# Patient Record
Sex: Male | Born: 1970 | Race: White | Hispanic: No | Smoking: Former smoker
Health system: Southern US, Community
[De-identification: ages and names within clinical notes are randomized; demographics above are authoritative.]

## PROBLEM LIST (undated history)

## (undated) DIAGNOSIS — I721 Aneurysm of artery of upper extremity: Secondary | ICD-10-CM

## (undated) DIAGNOSIS — I10 Essential (primary) hypertension: Secondary | ICD-10-CM

## (undated) DIAGNOSIS — M5412 Radiculopathy, cervical region: Secondary | ICD-10-CM

## (undated) DIAGNOSIS — J189 Pneumonia, unspecified organism: Secondary | ICD-10-CM

## (undated) DIAGNOSIS — K219 Gastro-esophageal reflux disease without esophagitis: Secondary | ICD-10-CM

## (undated) DIAGNOSIS — M25511 Pain in right shoulder: Secondary | ICD-10-CM

## (undated) DIAGNOSIS — J302 Other seasonal allergic rhinitis: Secondary | ICD-10-CM

## (undated) DIAGNOSIS — M5020 Other cervical disc displacement, unspecified cervical region: Secondary | ICD-10-CM

## (undated) HISTORY — PX: OTHER SURGICAL HISTORY: SHX169

## (undated) HISTORY — PX: HERNIA REPAIR: SHX51

## (undated) HISTORY — PX: FOOT SURGERY: SHX648

---

## 2005-12-01 ENCOUNTER — Ambulatory Visit: Payer: Self-pay | Admitting: Unknown Physician Specialty

## 2006-01-27 ENCOUNTER — Ambulatory Visit: Payer: Self-pay | Admitting: Unknown Physician Specialty

## 2006-09-14 ENCOUNTER — Ambulatory Visit: Payer: Self-pay | Admitting: Unknown Physician Specialty

## 2007-04-18 ENCOUNTER — Other Ambulatory Visit: Payer: Self-pay

## 2007-04-18 ENCOUNTER — Inpatient Hospital Stay: Payer: Self-pay | Admitting: Internal Medicine

## 2009-02-14 ENCOUNTER — Emergency Department: Payer: Self-pay | Admitting: Emergency Medicine

## 2009-06-01 ENCOUNTER — Ambulatory Visit: Payer: Self-pay | Admitting: Family Medicine

## 2010-12-29 ENCOUNTER — Emergency Department: Payer: Self-pay | Admitting: Emergency Medicine

## 2011-03-29 ENCOUNTER — Ambulatory Visit: Payer: Self-pay | Admitting: Internal Medicine

## 2011-04-27 ENCOUNTER — Ambulatory Visit: Payer: Self-pay | Admitting: Neurology

## 2011-10-21 ENCOUNTER — Emergency Department: Payer: Self-pay | Admitting: Emergency Medicine

## 2013-03-05 ENCOUNTER — Ambulatory Visit: Payer: Self-pay

## 2013-04-09 ENCOUNTER — Ambulatory Visit: Payer: Self-pay

## 2013-10-13 ENCOUNTER — Emergency Department: Payer: Self-pay | Admitting: Internal Medicine

## 2015-07-11 ENCOUNTER — Emergency Department
Admission: EM | Admit: 2015-07-11 | Discharge: 2015-07-11 | Disposition: A | Discharge status: Home / Self Care | Payer: Self-pay | Attending: Emergency Medicine | Admitting: Emergency Medicine

## 2015-07-11 ENCOUNTER — Emergency Department: Payer: Self-pay

## 2015-07-11 DIAGNOSIS — E860 Dehydration: Secondary | ICD-10-CM | POA: Insufficient documentation

## 2015-07-11 DIAGNOSIS — R55 Syncope and collapse: Secondary | ICD-10-CM | POA: Insufficient documentation

## 2015-07-11 HISTORY — DX: Gastro-esophageal reflux disease without esophagitis: K21.9

## 2015-07-11 HISTORY — DX: Other seasonal allergic rhinitis: J30.2

## 2015-07-11 LAB — CBC WITH DIFFERENTIAL/PLATELET
BASOS ABS: 0 10*3/uL (ref 0–0.1)
BASOS PCT: 0 %
EOS ABS: 0.2 10*3/uL (ref 0–0.7)
EOS PCT: 2 %
HCT: 47.5 % (ref 40.0–52.0)
Hemoglobin: 16.7 g/dL (ref 13.0–18.0)
Lymphocytes Relative: 24 %
Lymphs Abs: 1.9 10*3/uL (ref 1.0–3.6)
MCH: 30 pg (ref 26.0–34.0)
MCHC: 35.1 g/dL (ref 32.0–36.0)
MCV: 85.4 fL (ref 80.0–100.0)
MONO ABS: 0.7 10*3/uL (ref 0.2–1.0)
Monocytes Relative: 9 %
Neutro Abs: 5.2 10*3/uL (ref 1.4–6.5)
Neutrophils Relative %: 65 %
PLATELETS: 281 10*3/uL (ref 150–440)
RBC: 5.56 MIL/uL (ref 4.40–5.90)
RDW: 13.4 % (ref 11.5–14.5)
WBC: 8 10*3/uL (ref 3.8–10.6)

## 2015-07-11 LAB — COMPREHENSIVE METABOLIC PANEL
ALT: 42 U/L (ref 17–63)
AST: 27 U/L (ref 15–41)
Albumin: 4.2 g/dL (ref 3.5–5.0)
Alkaline Phosphatase: 46 U/L (ref 38–126)
Anion gap: 7 (ref 5–15)
BILIRUBIN TOTAL: 0.6 mg/dL (ref 0.3–1.2)
BUN: 11 mg/dL (ref 6–20)
CO2: 25 mmol/L (ref 22–32)
CREATININE: 0.83 mg/dL (ref 0.61–1.24)
Calcium: 9.5 mg/dL (ref 8.9–10.3)
Chloride: 107 mmol/L (ref 101–111)
GFR calc Af Amer: >60 mL/min (ref >60)
Glucose, Bld: 139 mg/dL — ABNORMAL HIGH (ref 65–99)
POTASSIUM: 3.7 mmol/L (ref 3.5–5.1)
Sodium: 139 mmol/L (ref 135–145)
TOTAL PROTEIN: 6.9 g/dL (ref 6.5–8.1)

## 2015-07-11 LAB — LIPASE, BLOOD: LIPASE: 37 U/L (ref 11–51)

## 2015-07-11 MED ORDER — SODIUM CHLORIDE 0.9 % IV BOLUS (SEPSIS)
1000.0000 mL | Freq: Once | INTRAVENOUS | Status: AC
Start: 2015-07-11 — End: 2015-07-11
  Administered 2015-07-11: 1000 mL via INTRAVENOUS

## 2015-07-11 NOTE — ED Notes (Signed)
Patient passed out in kitchen this evening.

## 2015-07-11 NOTE — ED Provider Notes (Signed)
[facility] Emergency Department Provider Note  ____________________________________________  Time seen: 9:40 PM  I have reviewed the triage vital signs and the nursing notes.   HISTORY  Chief Complaint Loss of Consciousness    HPI George Padilla is a 45 y.o. male brought to the ED after syncope at about 8:30 PM tonight. He is in his usual state of health, had discussion up from the couch and walked to the kitchen was looking as phone, when he turned around he suddenly passed out. Had no preceding symptoms. No chest pain shortness of breath back pain abdominal pain headache numbness tingling weakness or vision changes. When he woke up he did report some nausea but no focal symptoms. This is never happened before. He does work outside during labor and land grating, and does not drink much by way of fluids. He also tends to skip meals. Currently he is asymptomatic.     Past Medical History  Diagnosis Date  . GERD (gastroesophageal reflux disease)   . Seasonal allergies      There are no active problems to display for this patient.    Past Surgical History  Procedure Laterality Date  . Hernia repair       No current outpatient prescriptions on file. None  Allergies Bee venom   No family history on file.  Social History Social History  Substance Use Topics  . Smoking status: Never Smoker   . Smokeless tobacco: Never Used  . Alcohol Use: Yes    Review of Systems  Constitutional:   No fever or chills.  Eyes:   No vision changes.  ENT:   No sore throat. No rhinorrhea. Cardiovascular:   No chest pain. Respiratory:   No dyspnea or cough. Gastrointestinal:   Negative for abdominal pain, vomiting and diarrhea.  No bloody stool. Genitourinary:   Negative for dysuria or difficulty urinating. Musculoskeletal:   Negative for focal pain or swelling Neurological:   Negative for headaches 10-point ROS otherwise  negative.  ____________________________________________   PHYSICAL EXAM:  VITAL SIGNS: ED Triage Vitals  Enc Vitals Group     BP 07/11/15 2055 151/95 mmHg     Pulse Rate 07/11/15 2055 87     Resp 07/11/15 2055 19     Temp 07/11/15 2055 98 F (36.7 C)     Temp Source 07/11/15 2055 Oral     SpO2 07/11/15 2055 98 %     Weight 07/11/15 2055 180 lb (81.647 kg)     Height 07/11/15 2055  (1.727 m)     Head Cir --      Peak Flow --      Pain Score 07/11/15 2058 2     Pain Loc --      Pain Edu? --      Excl. in GC? --     Vital signs reviewed, nursing assessments reviewed.   Constitutional:   Alert and oriented. Well appearing and in no distress. Eyes:   No scleral icterus. No conjunctival pallor. PERRL. EOMI ENT   Head:   Normocephalic and atraumatic.   Nose:   No congestion/rhinnorhea. No septal hematoma   Mouth/Throat:   Dry mucous members, no pharyngeal erythema. No peritonsillar mass.    Neck:   No stridor. No SubQ emphysema. No meningismus. Hematological/Lymphatic/Immunilogical:   No cervical lymphadenopathy. Cardiovascular:   RRR. Symmetric bilateral radial and DP pulses.  No murmurs.  Respiratory:   Normal respiratory effort without tachypnea nor retractions. Breath sounds are  clear and equal bilaterally. No wheezes/rales/rhonchi. Gastrointestinal:   Soft with epigastric tenderness. Non distended. There is no CVA tenderness.  No rebound, rigidity, or guarding. Genitourinary:   deferred Musculoskeletal:   Nontender with normal range of motion in all extremities. No joint effusions.  No lower extremity tenderness.  No edema. Neurologic:   Normal speech and language.  CN 2-10 normal. Motor grossly intact. Normal gait No pronator drift No gross focal neurologic deficits are appreciated.  Skin:    Skin is warm, dry and intact. No rash noted.  No petechiae, purpura, or bullae.  ____________________________________________    LABS (pertinent  positives/negatives) (all labs ordered are listed, but only abnormal results are displayed) Labs Reviewed  COMPREHENSIVE METABOLIC PANEL - Abnormal; Notable for the following:    Glucose, Bld 139 (*)    All other components within normal limits  LIPASE, BLOOD  CBC WITH DIFFERENTIAL/PLATELET   ____________________________________________   EKG  Interpreted by me Sinus rhythm rate of 87, normal axis and intervals. Normal QRS ST segments and T waves  ____________________________________________    RADIOLOGY  X-ray abdomen and chest unremarkable  ____________________________________________   PROCEDURES   ____________________________________________   INITIAL IMPRESSION / ASSESSMENT AND PLAN / ED COURSE  Pertinent labs & imaging results that were available during my care of the patient were reviewed by me and considered in my medical decision making (see chart for details).  Patient well appearing no acute distress. Head syncope today, never had this before. Most likely dehydration due to his extensive work outside in hot weather which is been consistently in the 80s recently during the day. This is compounded by the report that he does not drink much fluids. No alarming symptoms before or after the syncope episode, total downtime of just a few minutes. No apparent seizure. Vital signs stable and unremarkable here. Has epigastric tenderness, he states this is normal for him due to his GERD. Low suspicion for AAA dissection perforation obstruction cholecystitis pancreatitis. Labs unremarkable, after IV fluids patient is ambulatory and remains a symptomatic. We'll discharge home. I informed him that his EKG does look like it possibly has a delta-wave in V2, but he had no chest pain shortness of breath dizziness or palpitations prior to the event. It would be highly unusual for a 45 year old male to have a first present presentation of WPW. I strongly encouraged him to follow up with  primary care who can reassess his symptoms and repeat EKG and plan for further workup if necessary at that time.     ____________________________________________   FINAL CLINICAL IMPRESSION(S) / ED DIAGNOSES  Final diagnoses:  Syncope, unspecified syncope type  Dehydration       Portions of this note were generated with dragon dictation software. Dictation errors may occur despite best attempts at proofreading.   Sharman CheekPhillip Naturi Alarid, MD 07/11/15 [phone]

## 2015-07-11 NOTE — Discharge Instructions (Signed)
Dehydration, Adult °Dehydration is a condition in which you do not have enough fluid or water in your body. It happens when you take in less fluid than you lose. Vital organs such as the kidneys, brain, and heart cannot function without a proper amount of fluids. Any loss of fluids from the body can cause dehydration.  °Dehydration can range from mild to severe. This condition should be treated right away to help prevent it from becoming severe. °CAUSES  °This condition may be caused by: °· Vomiting. °· Diarrhea. °· Excessive sweating, such as when exercising in hot or humid weather. °· Not drinking enough fluid during strenuous exercise or during an illness. °· Excessive urine output. °· Fever. °· Certain medicines. °RISK FACTORS °This condition is more likely to develop in: °· People who are taking certain medicines that cause the body to lose excess fluid (diuretics).   °· People who have a chronic illness, such as diabetes, that may increase urination. °· Older adults.   °· People who live at high altitudes.   °· People who participate in endurance sports.   °SYMPTOMS  °Mild Dehydration °· Thirst. °· Dry lips. °· Slightly dry mouth. °· Dry, warm skin. °Moderate Dehydration °· Very dry mouth.   °· Muscle cramps.   °· Dark urine and decreased urine production.   °· Decreased tear production.   °· Headache.   °· Light-headedness, especially when you stand up from a sitting position.   °Severe Dehydration °· Changes in skin.   °· Cold and clammy skin.   °· Skin does not spring back quickly when lightly pinched and released.   °· Changes in body fluids.   °· Extreme thirst.   °· No tears.   °· Not able to sweat when body temperature is high, such as in hot weather.   °· Minimal urine production.   °· Changes in vital signs.   °· Rapid, weak pulse (more than 100 beats per minute when you are sitting still).   °· Rapid breathing.   °· Low blood pressure.   °· Other changes.   °· Sunken eyes.   °· Cold hands and feet.    °· Confusion. °· Lethargy and difficulty being awakened. °· Fainting (syncope).   °· Short-term weight loss.   °· Unconsciousness. °DIAGNOSIS  °This condition may be diagnosed based on your symptoms. You may also have tests to determine how severe your dehydration is. These tests may include:  °· Urine tests.   °· Blood tests.   °TREATMENT  °Treatment for this condition depends on the severity. Mild or moderate dehydration can often be treated at home. Treatment should be started right away. Do not wait until dehydration becomes severe. Severe dehydration needs to be treated at the hospital. °Treatment for Mild Dehydration °· Drinking plenty of water to replace the fluid you have lost.   °· Replacing minerals in your blood (electrolytes) that you may have lost.   °Treatment for Moderate Dehydration  °· Consuming oral rehydration solution (ORS). °Treatment for Severe Dehydration °· Receiving fluid through an IV tube.   °· Receiving electrolyte solution through a feeding tube that is passed through your nose and into your stomach (nasogastric tube or NG tube). °· Correcting any abnormalities in electrolytes. °HOME CARE INSTRUCTIONS  °· Drink enough fluid to keep your urine clear or pale yellow.   °· Drink water or fluid slowly by taking small sips. You can also try sucking on ice cubes.  °· Have food or beverages that contain electrolytes. Examples include bananas and sports drinks. °· Take over-the-counter and prescription medicines only as told by your health care provider.   °· Prepare ORS according to the manufacturer's instructions. Take sips   of ORS every 5 minutes until your urine returns to normal. °· If you have vomiting or diarrhea, continue to try to drink water, ORS, or both.   °· If you have diarrhea, avoid:   °¨ Beverages that contain caffeine.   °¨ Fruit juice.   °¨ Milk.    °¨ Carbonated soft drinks. °· Do not take salt tablets. This can lead to the condition of having too much sodium in your body  (hypernatremia).   °SEEK MEDICAL CARE IF: °· You cannot eat or drink without vomiting. °· You have had moderate diarrhea during a period of more than 24 hours. °· You have a fever. °SEEK IMMEDIATE MEDICAL CARE IF:  °· You have extreme thirst. °· You have severe diarrhea. °· You have not urinated in 6-8 hours, or you have urinated only a small amount of very dark urine. °· You have shriveled skin. °· You are dizzy, confused, or both. °  °This information is not intended to replace advice given to you by your health care provider. Make sure you discuss any questions you have with your health care provider. °  °Document Released: 03/07/2005 Document Revised: 11/26/2014 Document Reviewed: 07/23/2014 °Elsevier Interactive Patient Education ©2016 [employer] ° °Syncope °Syncope is a medical term for fainting or passing out. This means you lose consciousness and drop to the ground. People are generally unconscious for less than 5 minutes. You may have some muscle twitches for up to 15 seconds before waking up and returning to normal. Syncope occurs more often in older adults, but it can happen to anyone. While most causes of syncope are not dangerous, syncope can be a sign of a serious medical problem. It is important to seek medical care.  °CAUSES  °Syncope is caused by a sudden drop in blood flow to the brain. The specific cause is often not determined. Factors that can bring on syncope include: °· Taking medicines that lower blood pressure. °· Sudden changes in posture, such as standing up quickly. °· Taking more medicine than prescribed. °· Standing in one place for too long. °· Seizure disorders. °· Dehydration and excessive exposure to heat. °· Low blood sugar (hypoglycemia). °· Straining to have a bowel movement. °· Heart disease, irregular heartbeat, or other circulatory problems. °· Fear, emotional distress, seeing blood, or severe pain. °SYMPTOMS  °Right before fainting, you may: °· Feel dizzy or  light-headed. °· Feel nauseous. °· See all white or all black in your field of vision. °· Have cold, clammy skin. °DIAGNOSIS  °Your health care provider will ask about your symptoms, perform a physical exam, and perform an electrocardiogram (ECG) to record the electrical activity of your heart. Your health care provider may also perform other heart or blood tests to determine the cause of your syncope which may include: °· Transthoracic echocardiogram (TTE). During echocardiography, sound waves are used to evaluate how blood flows through your heart. °· Transesophageal echocardiogram (TEE). °· Cardiac monitoring. This allows your health care provider to monitor your heart rate and rhythm in real time. °· Holter monitor. This is a portable device that records your heartbeat and can help diagnose heart arrhythmias. It allows your health care provider to track your heart activity for several days, if needed. °· Stress tests by exercise or by giving medicine that makes the heart beat faster. °TREATMENT  °In most cases, no treatment is needed. Depending on the cause of your syncope, your health care provider may recommend changing or stopping some of your medicines. °HOME CARE INSTRUCTIONS °· Have someone stay   with you until you feel stable. °· Do not drive, use machinery, or play sports until your health care provider says it is okay. °· Keep all follow-up appointments as directed by your health care provider. °· Lie down right away if you start feeling like you might faint. Breathe deeply and steadily. Wait until all the symptoms have passed. °· Drink enough fluids to keep your urine clear or pale yellow. °· If you are taking blood pressure or heart medicine, get up slowly and take several minutes to sit and then stand. This can reduce dizziness. °SEEK IMMEDIATE MEDICAL CARE IF:  °· You have a severe headache. °· You have unusual pain in the chest, abdomen, or back. °· You are bleeding from your mouth or rectum, or you  have black or tarry stool. °· You have an irregular or very fast heartbeat. °· You have pain with breathing. °· You have repeated fainting or seizure-like jerking during an episode. °· You faint when sitting or lying down. °· You have confusion. °· You have trouble walking. °· You have severe weakness. °· You have vision problems. °If you fainted, call your local emergency services (911 in [place] Do not drive yourself to the hospital.  °  °This information is not intended to replace advice given to you by your health care provider. Make sure you discuss any questions you have with your health care provider. °  °Document Released: 03/07/2005 Document Revised: 07/22/2014 Document Reviewed: 05/06/2011 °Elsevier Interactive Patient Education ©2016 Elsevier Inc. ° °

## 2017-08-21 ENCOUNTER — Ambulatory Visit: Payer: Self-pay | Admitting: Family Medicine

## 2017-08-21 ENCOUNTER — Encounter

## 2017-09-11 ENCOUNTER — Other Ambulatory Visit: Payer: Self-pay | Admitting: Family Medicine

## 2017-09-11 DIAGNOSIS — N5089 Other specified disorders of the male genital organs: Secondary | ICD-10-CM

## 2017-09-18 ENCOUNTER — Ambulatory Visit
Admission: RE | Admit: 2017-09-18 | Discharge: 2017-09-18 | Disposition: A | Discharge status: Home / Self Care | Payer: 59 | Source: Ambulatory Visit | Attending: Family Medicine | Admitting: Family Medicine

## 2017-09-18 DIAGNOSIS — N509 Disorder of male genital organs, unspecified: Secondary | ICD-10-CM | POA: Insufficient documentation

## 2017-09-18 DIAGNOSIS — N503 Cyst of epididymis: Secondary | ICD-10-CM | POA: Insufficient documentation

## 2017-09-18 DIAGNOSIS — I861 Scrotal varices: Secondary | ICD-10-CM | POA: Diagnosis not present

## 2017-09-18 DIAGNOSIS — N5089 Other specified disorders of the male genital organs: Secondary | ICD-10-CM

## 2017-09-29 ENCOUNTER — Encounter: Payer: Self-pay | Admitting: Podiatry

## 2017-09-29 ENCOUNTER — Ambulatory Visit (INDEPENDENT_AMBULATORY_CARE_PROVIDER_SITE_OTHER): Payer: 59 | Admitting: Podiatry

## 2017-09-29 VITALS — BP 138/82 | HR 75

## 2017-09-29 DIAGNOSIS — B070 Plantar wart: Secondary | ICD-10-CM

## 2017-10-02 NOTE — Progress Notes (Signed)
   Subjective: 47 year old male presenting today as a new patient with a chief complaint of a painful lesion noted to the plantar aspect of the right foot that has been there for several months. He believes another lesion is forming close to the one that is already present. Walking increases the pain. He has not done anything for treatment and was referred here by his PCP. Patient is here for further evaluation and treatment.   Past Medical History:  Diagnosis Date  . GERD (gastroesophageal reflux disease)   . Seasonal allergies     Objective: Physical Exam General: The patient is alert and oriented x3 in no acute distress.  Dermatology: Hyperkeratotic skin lesion noted to the plantar aspect of the right foot approximately 1 cm in diameter. Pinpoint bleeding noted upon debridement. Skin is warm, dry and supple bilateral lower extremities. Negative for open lesions or macerations.  Vascular: Palpable pedal pulses bilaterally. No edema or erythema noted. Capillary refill within normal limits.  Neurological: Epicritic and protective threshold grossly intact bilaterally.   Musculoskeletal Exam: Pain on palpation to the note skin lesion.  Range of motion within normal limits to all pedal and ankle joints bilateral. Muscle strength 5/5 in all groups bilateral.   Assessment: #1 plantar wart right forefoot x 2 #2 pain in right foot   Plan of Care:  #1 Patient was evaluated. #2 Excisional debridement of the plantar wart lesion was performed using a chisel blade. Cantharone was applied and the lesion was dressed with a dry sterile dressing. #3 patient is to return to clinic in 2 weeks.  Goes by George Padilla.    Felecia ShellingBrent M. Padilla, DPM [facility]  Dr. Felecia ShellingBrent M. Padilla, DPM    [address]                                        [place], [place] [address]                Office [phone]  Fax [phone](336) [phone]

## 2017-10-09 ENCOUNTER — Ambulatory Visit: Payer: Self-pay | Admitting: Urology

## 2017-10-17 ENCOUNTER — Ambulatory Visit (INDEPENDENT_AMBULATORY_CARE_PROVIDER_SITE_OTHER): Payer: 59 | Admitting: Podiatry

## 2017-10-17 ENCOUNTER — Ambulatory Visit: Payer: 59 | Admitting: Podiatry

## 2017-10-17 ENCOUNTER — Encounter: Payer: Self-pay | Admitting: Podiatry

## 2017-10-17 DIAGNOSIS — B070 Plantar wart: Secondary | ICD-10-CM | POA: Diagnosis not present

## 2017-10-22 NOTE — Progress Notes (Signed)
   Subjective: 47 year old male presenting today for follow up evaluation of two plantar warts on the right forefoot. He states he is doing well and the areas have healed appropriately. He denies any pain or complaints at this time. Patient is here for further evaluation and treatment.   Past Medical History:  Diagnosis Date  . GERD (gastroesophageal reflux disease)   . Seasonal allergies     Objective: Physical Exam General: The patient is alert and oriented x3 in no acute distress.  Dermatology: Hyperkeratotic skin lesions x 2 noted to the plantar aspect of the right foot approximately 1 cm in diameter. Pinpoint bleeding noted upon debridement. Skin is warm, dry and supple bilateral lower extremities. Negative for open lesions or macerations.  Vascular: Palpable pedal pulses bilaterally. No edema or erythema noted. Capillary refill within normal limits.  Neurological: Epicritic and protective threshold grossly intact bilaterally.   Musculoskeletal Exam: Pain on palpation to the note skin lesion.  Range of motion within normal limits to all pedal and ankle joints bilateral. Muscle strength 5/5 in all groups bilateral.   Assessment: #1 plantar wart right forefoot x 2 - resolved    Plan of Care:  #1 Patient was evaluated. #2 Light debridement of the plantar wart lesions was performed using a chisel blade. Salinocaine was applied and the lesion was dressed with a dry sterile dressing. #3 patient is to return to clinic as needed.  Goes by [name].    Felecia ShellingBrent M. Evans, DPM [facility]  Dr. Felecia ShellingBrent M. Evans, DPM    [address]                                        [place], [place] [address]                Office [phone]  Fax [phone]) [phone]

## 2017-10-31 DIAGNOSIS — K219 Gastro-esophageal reflux disease without esophagitis: Secondary | ICD-10-CM | POA: Insufficient documentation

## 2017-10-31 DIAGNOSIS — J302 Other seasonal allergic rhinitis: Secondary | ICD-10-CM | POA: Insufficient documentation

## 2017-10-31 NOTE — Progress Notes (Signed)
11/01/2017 3:09 PM   Marcene Brawnobert Jason Phillips June 20, 1970 [id number]  Referring provider: Jerl MinaHedrick, James, MD [address] [facility] [place], [place] [id number]  Chief Complaint  Patient presents with  . Testicle Pain    HPI: Patient is a 47 year old Caucasian male who was referred by Dr. Burnett ShengHedrick for testicular pain.  He was seen in June 2019 with a complaint of left testicular soreness and swelling.  Scrotal ultrasound on 09/18/2017 noted a small epididymal head cyst on each side. No other extratesticular masses. No inflammatory foci noted in either epididymis.  Testes appear normal bilaterally. No intratesticular mass on either side. No orchitis or testicular torsion on either side.  Small varicocele on the left.  He states the area of concern will enlarge and cause pain when he gets hot.  It is otherwise, not bothersome.    He is having nocturia and ED.  Patient denies any gross hematuria, dysuria or suprapubic/flank pain.  Patient denies any fevers, chills, nausea or vomiting.      PMH: Past Medical History:  Diagnosis Date  . GERD (gastroesophageal reflux disease)   . Seasonal allergies     Surgical History: Past Surgical History:  Procedure Laterality Date  . FOOT SURGERY    . HERNIA REPAIR      Home Medications:  Allergies as of 11/01/2017      Reactions   Amoxicillin Itching   Bee Venom       Medication List        Accurate as of 11/01/17  3:09 PM. Always use your most recent med list.          esomeprazole 40 MG capsule Commonly known as:  NEXIUM Take by mouth.       Allergies:  Allergies  Allergen Reactions  . Amoxicillin Itching  . Bee Venom     Family History: Family History  Problem Relation Age of Onset  . Bladder Cancer Neg Hx   . Kidney cancer Neg Hx   . Prostate cancer Neg Hx     Social History:  reports that he has quit smoking. He has quit using smokeless tobacco. He reports that he drinks alcohol. He reports that he  does not use drugs.  ROS: UROLOGY Frequent Urination?: No Hard to postpone urination?: No Burning/pain with urination?: No Get up at night to urinate?: Yes Leakage of urine?: No Urine stream starts and stops?: No Trouble starting stream?: No Do you have to strain to urinate?: No Blood in urine?: No Urinary tract infection?: No Sexually transmitted disease?: No Injury to kidneys or bladder?: No Painful intercourse?: No Weak stream?: No Erection problems?: Yes Penile pain?: No  Gastrointestinal Nausea?: No Vomiting?: No Indigestion/heartburn?: No Diarrhea?: No Constipation?: No  Constitutional Fever: No Night sweats?: No Weight loss?: No Fatigue?: No  Skin Skin rash/lesions?: No Itching?: No  Eyes Blurred vision?: No Double vision?: No  Ears/Nose/Throat Sore throat?: No Sinus problems?: No  Hematologic/Lymphatic Swollen glands?: No Easy bruising?: Yes  Cardiovascular Leg swelling?: No Chest pain?: No  Respiratory Cough?: Yes Shortness of breath?: No  Endocrine Excessive thirst?: No  Musculoskeletal Back pain?: Yes Joint pain?: Yes  Neurological Headaches?: No Dizziness?: No  Psychologic Depression?: No Anxiety?: No  Physical Exam: BP (!) 148/117 (BP Location: Left Arm, Patient Position: Sitting, Cuff Size: Normal)   Pulse 97   Ht 5\' 8"  (1.727 m)   Wt 192 lb 8 oz (87.3 kg)   BMI 29.27 kg/m   Constitutional:  Well  nourished. Alert and oriented, No acute distress. HEENT: [place] AT, moist mucus membranes.  Trachea midline, no masses. Cardiovascular: No clubbing, cyanosis, or edema. Respiratory: Normal respiratory effort, no increased work of breathing. GI: Abdomen is soft, non tender, non distended, no abdominal masses. Liver and spleen not palpable.  No hernias appreciated.  Stool sample for occult testing is not indicated.   GU: No CVA tenderness.  No bladder fullness or masses.  Patient with circumcised phallus.   Urethral meatus is  patent.  No penile discharge. No penile lesions or rashes. Scrotum without lesions, cysts, rashes and/or edema.  Testicles are located scrotally bilaterally. No masses are appreciated in the testicles. Left and right epididymis are normal.  Small left varicocele is normal.   Rectal: Patient with  normal sphincter tone. Anus and perineum without scarring or rashes. No rectal masses are appreciated. Prostate is approximately 45 grams, no nodules are appreciated. Seminal vesicles are normal. Skin: No rashes, bruises or suspicious lesions. Lymph: No cervical or inguinal adenopathy. Neurologic: Grossly intact, no focal deficits, moving all 4 extremities. Psychiatric: Normal mood and affect.  Laboratory Data: PSA   0.64 in 08/2017  Lab Results  Component Value Date   WBC 8.0 07/11/2015   HGB 16.7 07/11/2015   HCT 47.5 07/11/2015   MCV 85.4 07/11/2015   PLT 281 07/11/2015    Lab Results  Component Value Date   CREATININE 0.83 07/11/2015    No results found for: PSA  No results found for: TESTOSTERONE  No results found for: HGBA1C  No results found for: TSH  No results found for: CHOL, HDL, CHOLHDL, VLDL, LDLCALC  Lab Results  Component Value Date   AST 27 07/11/2015   Lab Results  Component Value Date   ALT 42 07/11/2015   No components found for: ALKALINEPHOPHATASE No components found for: BILIRUBINTOTAL  No results found for: ESTRADIOL  Urinalysis No results found for: COLORURINE, APPEARANCEUR, LABSPEC, PHURINE, GLUCOSEU, HGBUR, BILIRUBINUR, KETONESUR, PROTEINUR, UROBILINOGEN, NITRITE, LEUKOCYTESUR  I have reviewed the labs.   Pertinent Imaging: CLINICAL DATA:  Palpable fullness in scrotal sac  EXAM: SCROTAL ULTRASOUND  DOPPLER ULTRASOUND OF THE TESTICLES  TECHNIQUE: Complete ultrasound examination of the testicles, epididymis, and other scrotal structures was performed. Color and spectral Doppler ultrasound were also utilized to evaluate blood flow to  the testicles.  COMPARISON:  None.  FINDINGS: Right testicle  Measurements: 4.9 x 2.2 x 3.5 cm. No mass or microlithiasis visualized.  Left testicle  Measurements: 4.4 x 1.9 x 2.7 cm. No mass or microlithiasis visualized.  Right epididymis: There is a cyst in the epididymal head region on the right measuring 0.5 x 0.4 x 0.5 cm. No other right-sided extratesticular mass evident. No inflammatory focus noted.  Left epididymis: Normal there is a cyst arising from the head of the epididymis on the left measuring 0.5 x 0.6 x 0.6 cm. No other left-sided extratesticular mass evident. No inflammatory focus noted.  Hydrocele:  None visualized.  Varicocele: There is a small left-sided varicocele with Valsalva maneuver. No varicocele evident on the right.  Pulsed Doppler interrogation of both testes demonstrates normal low resistance arterial and venous waveforms bilaterally.  No scrotal wall thickening or scrotal abscess.  IMPRESSION: 1. Small epididymal head cyst on each side. No other extratesticular masses. No inflammatory foci noted in either epididymis.  2. Testes appear normal bilaterally. No intratesticular mass on either side. No orchitis or testicular torsion on either side.  3.  Small varicocele on the left.  Electronically Signed   By: Bretta BangWilliam  Woodruff III M.D.   On: 09/18/2017 09:26  I have independently reviewed the films.    Assessment & Plan:    1. Left varicocele Explained findings to the patient Continue to manage conservatively    2. Bilateral epididymal cysts Explained that these are benign  Continue to perform monthly testicular exams and report any changes RTC in one year for exam   Return in about 1 year (around 11/02/2018) for PSA and exam .  These notes generated with voice recognition software. I apologize for typographical errors.  Michiel CowboySHANNON Myrtle Haller, PA-C  [facility] [address]   [address] [place], [place] [address] [phone]

## 2017-11-01 ENCOUNTER — Ambulatory Visit (INDEPENDENT_AMBULATORY_CARE_PROVIDER_SITE_OTHER): Payer: 59 | Admitting: Urology

## 2017-11-01 ENCOUNTER — Encounter: Payer: Self-pay | Admitting: Urology

## 2017-11-01 VITALS — BP 148/117 | HR 97 | Ht 68.0 in | Wt 192.5 lb

## 2017-11-01 DIAGNOSIS — N503 Cyst of epididymis: Secondary | ICD-10-CM

## 2017-11-01 DIAGNOSIS — I861 Scrotal varices: Secondary | ICD-10-CM

## 2017-12-25 ENCOUNTER — Emergency Department
Admission: EM | Admit: 2017-12-25 | Discharge: 2017-12-25 | Disposition: A | Discharge status: Home / Self Care | Payer: 59 | Attending: Emergency Medicine | Admitting: Emergency Medicine

## 2017-12-25 ENCOUNTER — Other Ambulatory Visit: Payer: Self-pay

## 2017-12-25 DIAGNOSIS — Z046 Encounter for general psychiatric examination, requested by authority: Secondary | ICD-10-CM | POA: Diagnosis present

## 2017-12-25 DIAGNOSIS — F1010 Alcohol abuse, uncomplicated: Secondary | ICD-10-CM

## 2017-12-25 DIAGNOSIS — K219 Gastro-esophageal reflux disease without esophagitis: Secondary | ICD-10-CM | POA: Diagnosis not present

## 2017-12-25 DIAGNOSIS — F10920 Alcohol use, unspecified with intoxication, uncomplicated: Secondary | ICD-10-CM | POA: Diagnosis not present

## 2017-12-25 DIAGNOSIS — F1994 Other psychoactive substance use, unspecified with psychoactive substance-induced mood disorder: Secondary | ICD-10-CM | POA: Diagnosis not present

## 2017-12-25 DIAGNOSIS — F331 Major depressive disorder, recurrent, moderate: Secondary | ICD-10-CM

## 2017-12-25 DIAGNOSIS — Z79899 Other long term (current) drug therapy: Secondary | ICD-10-CM | POA: Diagnosis not present

## 2017-12-25 DIAGNOSIS — Z733 Stress, not elsewhere classified: Secondary | ICD-10-CM | POA: Diagnosis not present

## 2017-12-25 DIAGNOSIS — Z87891 Personal history of nicotine dependence: Secondary | ICD-10-CM | POA: Insufficient documentation

## 2017-12-25 DIAGNOSIS — F1914 Other psychoactive substance abuse with psychoactive substance-induced mood disorder: Secondary | ICD-10-CM | POA: Insufficient documentation

## 2017-12-25 DIAGNOSIS — F4325 Adjustment disorder with mixed disturbance of emotions and conduct: Secondary | ICD-10-CM

## 2017-12-25 LAB — CBC WITH DIFFERENTIAL/PLATELET
Basophils Absolute: 0 10*3/uL (ref 0–0.1)
Basophils Relative: 1 %
EOS ABS: 0.1 10*3/uL (ref 0–0.7)
Eosinophils Relative: 2 %
HEMATOCRIT: 49 % (ref 40.0–52.0)
HEMOGLOBIN: 17.5 g/dL (ref 13.0–18.0)
LYMPHS ABS: 1.9 10*3/uL (ref 1.0–3.6)
LYMPHS PCT: 35 %
MCH: 30.6 pg (ref 26.0–34.0)
MCHC: 35.6 g/dL (ref 32.0–36.0)
MCV: 86 fL (ref 80.0–100.0)
MONOS PCT: 7 %
Monocytes Absolute: 0.4 10*3/uL (ref 0.2–1.0)
NEUTROS ABS: 3 10*3/uL (ref 1.4–6.5)
NEUTROS PCT: 55 %
Platelets: 322 10*3/uL (ref 150–440)
RBC: 5.7 MIL/uL (ref 4.40–5.90)
RDW: 13.5 % (ref 11.5–14.5)
WBC: 5.4 10*3/uL (ref 3.8–10.6)

## 2017-12-25 LAB — URINE DRUG SCREEN, QUALITATIVE (ARMC ONLY)
AMPHETAMINES, UR SCREEN: NOT DETECTED
BENZODIAZEPINE, UR SCRN: NOT DETECTED
Barbiturates, Ur Screen: NOT DETECTED
CANNABINOID 50 NG, UR ~~LOC~~: NOT DETECTED
Cocaine Metabolite,Ur ~~LOC~~: NOT DETECTED
MDMA (ECSTASY) UR SCREEN: NOT DETECTED
Methadone Scn, Ur: NOT DETECTED
OPIATE, UR SCREEN: NOT DETECTED
PHENCYCLIDINE (PCP) UR S: NOT DETECTED
Tricyclic, Ur Screen: NOT DETECTED

## 2017-12-25 LAB — COMPREHENSIVE METABOLIC PANEL
ALK PHOS: 45 U/L (ref 38–126)
ALT: 39 U/L (ref 0–44)
AST: 29 U/L (ref 15–41)
Albumin: 4.5 g/dL (ref 3.5–5.0)
Anion gap: 11 (ref 5–15)
BILIRUBIN TOTAL: 0.9 mg/dL (ref 0.3–1.2)
BUN: 9 mg/dL (ref 6–20)
CALCIUM: 9.3 mg/dL (ref 8.9–10.3)
CO2: 24 mmol/L (ref 22–32)
CREATININE: 0.83 mg/dL (ref 0.61–1.24)
Chloride: 104 mmol/L (ref 98–111)
GFR calc non Af Amer: >60 mL/min (ref >60)
Glucose, Bld: 112 mg/dL — ABNORMAL HIGH (ref 70–99)
Potassium: 3.9 mmol/L (ref 3.5–5.1)
Sodium: 139 mmol/L (ref 135–145)
TOTAL PROTEIN: 7.5 g/dL (ref 6.5–8.1)

## 2017-12-25 LAB — ETHANOL: Alcohol, Ethyl (B): 88 mg/dL — ABNORMAL HIGH (ref <10)

## 2017-12-25 LAB — ACETAMINOPHEN LEVEL: Acetaminophen (Tylenol), Serum: <10 ug/mL — ABNORMAL LOW (ref 10–30)

## 2017-12-25 LAB — SALICYLATE LEVEL

## 2017-12-25 MED ORDER — THIAMINE HCL 100 MG/ML IJ SOLN
Freq: Once | INTRAVENOUS | Status: DC
  Filled 2017-12-25: qty 1000

## 2017-12-25 MED ORDER — LORAZEPAM 2 MG/ML IJ SOLN: 0.0000 mg | Freq: Four times a day (QID) | INTRAMUSCULAR | Status: DC

## 2017-12-25 MED ORDER — SODIUM CHLORIDE 0.9 % IV BOLUS: 1000.0000 mL | Freq: Once | INTRAVENOUS | Status: DC

## 2017-12-25 MED ORDER — THIAMINE HCL 100 MG/ML IJ SOLN: 100.0000 mg | Freq: Every day | INTRAMUSCULAR | Status: DC

## 2017-12-25 NOTE — ED Notes (Signed)
BEHAVIORAL HEALTH ROUNDING Patient sleeping: No. Patient alert and oriented: yes Behavior appropriate: Yes.  ; If no, describe:  Nutrition and fluids offered: yes Toileting and hygiene offered: Yes  Sitter present: q15 minute observations and security  monitoring Law enforcement present: Yes  ODS  

## 2017-12-25 NOTE — ED Provider Notes (Signed)
Cleared for discharge by Dr. Luz Lex, MD 12/25/17 1216

## 2017-12-25 NOTE — ED Provider Notes (Signed)
[facility] Emergency Department Provider Note   ____________________________________________   First MD Initiated Contact with Patient 12/25/17 0201     (approximate)  I have reviewed the triage vital signs and the nursing notes.   HISTORY  Chief Complaint Medical Clearance    HPI George Padilla is a 47 y.o. male brought to the ED under IVC for suicidal ideation.  Patient reportedly drank 21 beers over the course of the day since 11 AM and told his family he was going to hang himself.  Officers found patient with a rope in his shed behind his house.  Review of records show that he recently saw his PCP at the end of September for her situational stress and was started on Celexa and trazodone for sleep.  Patient currently voices no medical complaints.  Denies fever, chills, chest pain, shortness of breath, abdominal pain, nausea, vomiting or diarrhea.  Currently denies active SI/HI/AH/VH.   Past Medical History:  Diagnosis Date  . GERD (gastroesophageal reflux disease)   . Seasonal allergies     Patient Active Problem List   Diagnosis Date Noted  . GERD (gastroesophageal reflux disease) 10/31/2017  . Seasonal allergies 10/31/2017    Past Surgical History:  Procedure Laterality Date  . FOOT SURGERY    . HERNIA REPAIR      Prior to Admission medications   Medication Sig Start Date End Date Taking? Authorizing Provider  esomeprazole (NEXIUM) 40 MG capsule Take by mouth. 09/06/17 09/06/18  [provider]    Allergies Amoxicillin and Bee venom  Family History  Problem Relation Age of Onset  . Bladder Cancer Neg Hx   . Kidney cancer Neg Hx   . Prostate cancer Neg Hx     Social History Social History   Tobacco Use  . Smoking status: Former Games developer  . Smokeless tobacco: Former Engineer, water Use Topics  . Alcohol use: Yes  . Drug use: No    Review of Systems  Constitutional: No fever/chills Eyes: No visual changes. ENT: No  sore throat. Cardiovascular: Denies chest pain. Respiratory: Denies shortness of breath. Gastrointestinal: No abdominal pain.  No nausea, no vomiting.  No diarrhea.  No constipation. Genitourinary: Negative for dysuria. Musculoskeletal: Negative for back pain. Skin: Negative for rash. Neurological: Negative for headaches, focal weakness or numbness. Psychiatric:Positive for depression with SI.  ____________________________________________   PHYSICAL EXAM:  VITAL SIGNS: ED Triage Vitals  Enc Vitals Group     BP 12/25/17 0007 (!) 152/97     Pulse Rate 12/25/17 0007 79     Resp 12/25/17 0007 18     Temp 12/25/17 0007 97.9 F (36.6 C)     Temp Source 12/25/17 0007 Oral     SpO2 12/25/17 0007 95 %     Weight 12/25/17 0007 190 lb (86.2 kg)     Height 12/25/17 0007 5\' 8"  (1.727 m)     Head Circumference --      Peak Flow --      Pain Score 12/25/17 0019 0     Pain Loc --      Pain Edu? --      Excl. in GC? --     Constitutional: Asleep, awakened for exam.  Alert and oriented. Well appearing and in no acute distress. Eyes: Conjunctivae are bloodshot bilaterally. PERRL. EOMI. Head: Atraumatic. Nose: Atraumatic. Mouth/Throat: Mucous membranes are moist.  Oropharynx non-erythematous. Neck: No stridor.  No cervical spine tenderness to palpation. Cardiovascular: Normal rate, regular  rhythm. Grossly normal heart sounds.  Good peripheral circulation. Respiratory: Normal respiratory effort.  No retractions. Lungs CTAB. Gastrointestinal: Soft and nontender. No distention. No abdominal bruits. No CVA tenderness. Musculoskeletal: No lower extremity tenderness nor edema.  No joint effusions. Neurologic:  Normal speech and language. No gross focal neurologic deficits are appreciated. No gait instability. Skin:  Skin is warm, dry and intact. No rash noted. Psychiatric: Mood and affect are flat. Speech and behavior are normal.  ____________________________________________   LABS (all  labs ordered are listed, but only abnormal results are displayed)  Labs Reviewed  COMPREHENSIVE METABOLIC PANEL - Abnormal; Notable for the following components:      Result Value   Glucose, Bld 112 (*)    All other components within normal limits  ACETAMINOPHEN LEVEL - Abnormal; Notable for the following components:   Acetaminophen (Tylenol), Serum <10 (*)    All other components within normal limits  ETHANOL - Abnormal; Notable for the following components:   Alcohol, Ethyl (B) 88 (*)    All other components within normal limits  CBC WITH DIFFERENTIAL/PLATELET  SALICYLATE LEVEL  URINE DRUG SCREEN, QUALITATIVE ([facility] ONLY)   ____________________________________________  EKG  None ____________________________________________  RADIOLOGY  ED MD interpretation: None  Official radiology report(s): No results found.  ____________________________________________   PROCEDURES  Procedure(s) performed: None  Procedures  Critical Care performed: No  ____________________________________________   INITIAL IMPRESSION / ASSESSMENT AND PLAN / ED COURSE  As part of my medical decision making, I reviewed the following data within the electronic MEDICAL RECORD NUMBER Nursing notes reviewed and incorporated, Labs reviewed, Old chart reviewed, A consult was requested and obtained from this/these consultant(s) Psychiatry and Notes from prior ED visits   47 year old male brought to the ED under IVC for suicidal ideation.  Will maintain IVC pending [facility] psychiatry evaluation.   Clinical Course as of Dec 26 623  Mon Dec 25, 2017  1610 Patient was evaluated by [facility] psychiatrist Dr. Hermelinda Medicus who has rescinded his IVC and recommends discharge home.  I am uncomfortable with this plan for discharge has recent escalation of stressors which escalated in him trying to hang himself tonight.  Patient is currently sleeping.  Will hold him for inpatient psychiatric evaluation this morning.   [JS]      Clinical Course User Index [JS] Irean Hong, MD     ____________________________________________   FINAL CLINICAL IMPRESSION(S) / ED DIAGNOSES  Final diagnoses:  Moderate episode of recurrent major depressive disorder [facility])     ED Discharge Orders    None       Note:  This document was prepared using Dragon voice recognition software and may include unintentional dictation errors.    Irean Hong, MD 12/25/17 [phone]

## 2017-12-25 NOTE — Consult Note (Signed)
  Psychiatry: Patient seen.  Chart reviewed.  Brief note, full note to follow.  Patient currently does not meet commitment criteria and does not require inpatient treatment.  Psychoeducation and supportive counseling completed.  Case reviewed with ER doctor.

## 2017-12-25 NOTE — ED Notes (Signed)
Pt given lunch tray and telephone to call for a ride. Pt stated it will take 30-45 mins for ride to get here.
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
## 2017-12-25 NOTE — ED Notes (Signed)

## 2017-12-25 NOTE — ED Notes (Signed)
Patient observed lying in bed with eyes closed  Even, unlabored respirations observed   NAD pt appears to be sleeping  I will continue to monitor along with every 15 minute visual observations and ongoing security monitoring    
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
## 2017-12-25 NOTE — ED Notes (Signed)
ED  Is the patient under IVC or is there intent for IVC: Yes.   Is the patient medically cleared: Yes.   Is there vacancy in the ED BHU: Yes.   Is the population mix appropriate for patient: Yes.   Is the patient awaiting placement in inpatient or outpatient setting:  Has the patient had a psychiatric consult:  Consult pending  Survey of unit performed for contraband, proper placement and condition of furniture, tampering with fixtures in bathroom, shower, and each patient room: Yes.  ; Findings:  APPEARANCE/BEHAVIOR Calm and cooperative NEURO ASSESSMENT Orientation: oriented x3  Denies pain Hallucinations: No.None noted (Hallucinations) denies  Speech: Normal Gait: normal RESPIRATORY ASSESSMENT Even  Unlabored respirations  CARDIOVASCULAR ASSESSMENT Pulses equal   regular rate  Skin warm and dry   GASTROINTESTINAL ASSESSMENT no GI complaint EXTREMITIES Full ROM  PLAN OF CARE Provide calm/safe environment. Vital signs assessed twice daily. ED BHU Assessment once each 12-hour shift. Collaborate with TTS daily or as condition indicates. Assure the ED provider has rounded once each shift. Provide and encourage hygiene. Provide redirection as needed. Assess for escalating behavior; address immediately and inform ED provider.  Assess family dynamic and appropriateness for visitation as needed: Yes.  ; If necessary, describe findings:  Educate the patient/family about BHU procedures/visitation: Yes.  ; If necessary, describe findings:   

## 2017-12-25 NOTE — ED Notes (Signed)
Pt asleep in bed, breakfast tray placed in rm.  

## 2017-12-25 NOTE — ED Notes (Signed)
This Clinical research associate spoke with patient's son(Brandon Radwan--[phone]); who states that patient a lot stress; has not been sleeping; and he was put on Xanax for sleep. This is patient's third wife; and he has been having problems with the second wife; as, she is asking for more money from him; and she has not allowed him to see his 48 y.o. Daughter; and today, he sent out text messages to his son and brother; like saying good bye; his brother and son called his current wife Olegario Messier [place]--[phone] telling her to check on him; she went to check on him; and saw that, he was standing on a stool holding a rope; she called the brother and son to come over. The son and brother grabbed him from the stool and took the rope and talked to him and called the police; who brought him to the hospital.

## 2017-12-25 NOTE — ED Notes (Signed)
Pt changed out into behavioral scrubs

## 2017-12-25 NOTE — ED Triage Notes (Signed)
Patient to ED with IVC papers accompanied by [facility]. Patient states he has been drinking today since 11am and has had 21 beers. Was sitting around "thinking about things on my mind" and made the statement he was going to hang himself. Patient calm and cooperative at this time.

## 2018-05-09 ENCOUNTER — Ambulatory Visit
Admission: EM | Admit: 2018-05-09 | Discharge: 2018-05-09 | Disposition: A | Discharge status: Home / Self Care | Payer: 59 | Attending: Family Medicine | Admitting: Family Medicine

## 2018-05-09 ENCOUNTER — Other Ambulatory Visit: Payer: Self-pay

## 2018-05-09 DIAGNOSIS — R69 Illness, unspecified: Principal | ICD-10-CM

## 2018-05-09 DIAGNOSIS — J111 Influenza due to unidentified influenza virus with other respiratory manifestations: Secondary | ICD-10-CM

## 2018-05-09 DIAGNOSIS — R0981 Nasal congestion: Secondary | ICD-10-CM

## 2018-05-09 DIAGNOSIS — R509 Fever, unspecified: Secondary | ICD-10-CM

## 2018-05-09 DIAGNOSIS — B9789 Other viral agents as the cause of diseases classified elsewhere: Secondary | ICD-10-CM

## 2018-05-09 DIAGNOSIS — Z87891 Personal history of nicotine dependence: Secondary | ICD-10-CM

## 2018-05-09 DIAGNOSIS — M791 Myalgia, unspecified site: Secondary | ICD-10-CM

## 2018-05-09 DIAGNOSIS — R05 Cough: Secondary | ICD-10-CM

## 2018-05-09 DIAGNOSIS — R5383 Other fatigue: Secondary | ICD-10-CM

## 2018-05-09 MED ORDER — OSELTAMIVIR PHOSPHATE 75 MG PO CAPS: 75.0000 mg | ORAL_CAPSULE | Freq: Two times a day (BID) | ORAL | 0 refills | Status: DC

## 2018-05-09 NOTE — ED Provider Notes (Signed)
[facility]    CSN: [id number] Arrival date & time: 05/09/18  1009     History   Chief Complaint Chief Complaint  Patient presents with  . Fever    HPI George Padilla is a 48 y.o. male.   The history is provided by the patient.  Fever  Associated symptoms: congestion, cough and myalgias   URI  Presenting symptoms: congestion, cough, fatigue and fever   Severity:  Moderate Onset quality:  Sudden Duration:  1 day Timing:  Constant Progression:  Worsening Chronicity:  New Relieved by:  None tried Ineffective treatments:  None tried Associated symptoms: myalgias   Risk factors: sick contacts     Past Medical History:  Diagnosis Date  . GERD (gastroesophageal reflux disease)   . Seasonal allergies     Patient Active Problem List   Diagnosis Date Noted  . Substance induced mood disorder (HCC) 12/25/2017  . Alcohol abuse 12/25/2017  . 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 GERD (gastroesophageal reflux disease) 10/31/2017  . Seasonal allergies 10/31/2017    Past Surgical History:  Procedure Laterality Date  . FOOT SURGERY    . HERNIA REPAIR         Home Medications    Prior to Admission medications   Medication Sig Start Date End Date Taking? Authorizing Provider  escitalopram (LEXAPRO) 10 MG tablet  01/11/18  Yes [provider]  esomeprazole (NEXIUM) 40 MG capsule Take by mouth. 09/06/17 09/06/18 Yes [provider]  losartan (COZAAR) 25 MG tablet Take by mouth. 03/07/18 03/07/19 Yes [provider]  sulfamethoxazole-trimethoprim (BACTRIM DS,SEPTRA DS) 800-160 MG tablet  05/02/18  Yes [provider]  oseltamivir (TAMIFLU) 75 MG capsule Take 1 capsule (75 mg total) by mouth 2 (two) times daily. 05/09/18   Payton Mccallum, MD    Family History Family History  Problem Relation Age of Onset  . Bladder Cancer Neg Hx   . Kidney cancer Neg Hx   . Prostate cancer Neg Hx      Social History Social History   Tobacco Use  . Smoking status: Former Games developer  . Smokeless tobacco: Former Engineer, water Use Topics  . Alcohol use: Yes    Comment: occasionally  . Drug use: No     Allergies   Amoxicillin; Bee venom; and Prednisone   Review of Systems Review of Systems  Constitutional: Positive for fatigue and fever.  HENT: Positive for congestion.   Respiratory: Positive for cough.   Musculoskeletal: Positive for myalgias.     Physical Exam Triage Vital Signs ED Triage Vitals  Enc Vitals Group     BP 05/09/18 1043 (!) 144/91     Pulse Rate 05/09/18 1043 (!) 113     Resp 05/09/18 1043 18     Temp 05/09/18 1043 99.7 F (37.6 C)     Temp Source 05/09/18 1043 Oral     SpO2 05/09/18 1043 97 %     Weight 05/09/18 1040 192 lb (87.1 kg)     Height 05/09/18 1040 5\' 8"  (1.727 m)     Head Circumference --      Peak Flow --      Pain Score 05/09/18 1040 4     Pain Loc --      Pain Edu? --      Excl. in GC? --    No data found.  Updated Vital Signs BP (!) 144/91 (BP Location: Right Arm)   Pulse Marland Kitchen)  113   Temp 99.7 F (37.6 C) (Oral)   Resp 18   Ht 5\' 8"  (1.727 m)   Wt 87.1 kg   SpO2 97%   BMI 29.19 kg/m   Visual Acuity Right Eye Distance:   Left Eye Distance:   Bilateral Distance:    Right Eye Near:   Left Eye Near:    Bilateral Near:     Physical Exam Vitals signs and nursing note reviewed.  Constitutional:      General: He is not in acute distress.    Appearance: He is well-developed. He is not toxic-appearing or diaphoretic.  HENT:     Head: Normocephalic and atraumatic.     Right Ear: Tympanic membrane, ear canal and external ear normal.     Left Ear: Tympanic membrane, ear canal and external ear normal.     Nose: Nose normal.     Mouth/Throat:     Pharynx: Uvula midline. No oropharyngeal exudate.     Tonsils: No tonsillar abscesses.  Neck:     Musculoskeletal: Normal range of motion and neck supple.     Thyroid: No  thyromegaly.     Trachea: No tracheal deviation.  Cardiovascular:     Rate and Rhythm: Normal rate and regular rhythm.     Heart sounds: Normal heart sounds.  Pulmonary:     Effort: Pulmonary effort is normal. No respiratory distress.     Breath sounds: Normal breath sounds. No stridor. No wheezing, rhonchi or rales.  Chest:     Chest wall: No tenderness.  Lymphadenopathy:     Cervical: No cervical adenopathy.  Skin:    General: Skin is warm and dry.     Findings: No rash.  Neurological:     Mental Status: He is alert.      UC Treatments / Results  Labs (all labs ordered are listed, but only abnormal results are displayed) Labs Reviewed - No data to display  EKG None  Radiology No results found.  Procedures Procedures (including critical care time)  Medications Ordered in UC Medications - No data to display  Initial Impression / Assessment and Plan / UC Course  I have reviewed the triage vital signs and the nursing notes.  Pertinent labs & imaging results that were available during my care of the patient were reviewed by me and considered in my medical decision making (see chart for details).      Final Clinical Impressions(s) / UC Diagnoses   Final diagnoses:  Influenza-like illness    ED Prescriptions    Medication Sig Dispense Auth. Provider   oseltamivir (TAMIFLU) 75 MG capsule Take 1 capsule (75 mg total) by mouth 2 (two) times daily. 10 capsule Payton Mccallum, MD     1. diagnosis reviewed with patient 2. rx as per orders above; reviewed possible side effects, interactions, risks and benefits  3. Recommend supportive treatment with rest, fluids, otc analgesics prn 4. Follow-up prn if symptoms worsen or don't improve  Controlled Substance Prescriptions [place] Controlled Substance Registry consulted? Not Applicable   Payton Mccallum, MD 05/09/18 1112

## 2018-05-09 NOTE — ED Triage Notes (Signed)
Patient complains of cough, fever, body aches that started suddenly yesterday.

## 2018-11-01 ENCOUNTER — Ambulatory Visit: Payer: PRIVATE HEALTH INSURANCE | Admitting: Urology

## 2018-11-11 NOTE — Progress Notes (Signed)
11/12/2018 3:38 PM   Chinita Greenland Jun 19, 1970 [id number]  Referring provider: Maryland Pink, MD [address] [facility] [place],  [place] [address]  Chief Complaint  Patient presents with  . Follow-up    HPI: Patient is a 48 year old male who with bilateral epididymal cysts and a left varicocele who presents today for follow up.  Bilateral epididymal cysts/left varicocele Scrotal ultrasound on 09/18/2017 noted a small epididymal head cyst on each side. No other extratesticular masses. No inflammatory foci noted in either epididymis.  Testes appear normal bilaterally. No intratesticular mass on either side. No orchitis or testicular torsion on either side.  Small varicocele on the left.  BPH WITH LUTS  (prostate and/or bladder) IPSS score: 5/1      Major complaint(s):  No complaints.  Denies any dysuria, hematuria or suprapubic pain.   Denies any recent fevers, chills, nausea or vomiting.  He does not have a family history of PCa.  IPSS    Row Name 11/12/18 1500         International Prostate Symptom Score   How often have you had the sensation of not emptying your bladder?  Less than 1 in 5     How often have you had to urinate less than every two hours?  Less than 1 in 5 times     How often have you found you stopped and started again several times when you urinated?  Less than half the time     How often have you found it difficult to postpone urination?  Not at All     How often have you had a weak urinary stream?  Not at All     How often have you had to strain to start urination?  Not at All     How many times did you typically get up at night to urinate?  1 Time     Total IPSS Score  5       Quality of Life due to urinary symptoms   If you were to spend the rest of your life with your urinary condition just the way it is now how would you feel about that?  Pleased        Score:  1-7 Mild 8-19 Moderate 20-35 Severe  Erectile dysfunction His  SHIM score is 15, which is mild to moderate ED.   His previous SHIM   He has been having difficulty with erections for the last several months.  His risk factors for ED are age, BPH and substance/alcohol abuse.  He denies any painful erections or curvatures with his erections.   He is no longer having spontaneous erections.   SHIM    Row Name 11/12/18 1521         SHIM: Over the last 6 months:   How do you rate your confidence that you could get and keep an erection?  Moderate     When you had erections with sexual stimulation, how often were your erections hard enough for penetration (entering your partner)?  Sometimes (about half the time)     During sexual intercourse, how often were you able to maintain your erection after you had penetrated (entered) your partner?  Sometimes (about half the time)     During sexual intercourse, how difficult was it to maintain your erection to completion of intercourse?  Difficult     When you attempted sexual intercourse, how often was it satisfactory for you?  Sometimes (about  half the time)       SHIM Total Score   SHIM  15        Score: 1-7 Severe ED 8-11 Moderate ED 12-16 Mild-Moderate ED 17-21 Mild ED 22-25 No ED     PMH: Past Medical History:  Diagnosis Date  . GERD (gastroesophageal reflux disease)   . Seasonal allergies     Surgical History: Past Surgical History:  Procedure Laterality Date  . FOOT SURGERY    . HERNIA REPAIR      Home Medications:  Allergies as of 11/12/2018      Reactions   Amoxicillin Itching   Bee Venom    Prednisone Swelling      Medication List       Accurate as of November 12, 2018  3:38 PM. If you have any questions, ask your nurse or doctor.        STOP taking these medications   esomeprazole 40 MG capsule Commonly known as: NEXIUM Stopped by: Jaret Coppedge, PA-C   oseltamivir 75 MG capsule Commonly known as: TAMIFLU Stopped by: Ramses Klecka, PA-C   sulfamethoxazole-trimethoprim  800-160 MG tablet Commonly known as: BACTRIM DS Stopped by: Indica Marcott, PA-C     TAKE these medications   beclomethasone 40 MCG/ACT inhaler Commonly known as: QVAR Inhale into the lungs.   EPINEPHrine 0.3 mg/0.3 mL Soaj injection Commonly known as: EPI-PEN Inject into the muscle.   escitalopram 10 MG tablet Commonly known as: LEXAPRO   fluticasone 50 MCG/ACT nasal spray Commonly known as: FLONASE Place into the nose.   losartan 25 MG tablet Commonly known as: COZAAR Take by mouth.   meloxicam 15 MG tablet Commonly known as: MOBIC   montelukast 10 MG tablet Commonly known as: SINGULAIR Take by mouth.   omeprazole 40 MG capsule Commonly known as: PRILOSEC Take by mouth.   traZODone 50 MG tablet Commonly known as: DESYREL Take by mouth.       Allergies:  Allergies  Allergen Reactions  . Amoxicillin Itching  . Bee Venom   . Prednisone Swelling    Family History: Family History  Problem Relation Age of Onset  . Bladder Cancer Neg Hx   . Kidney cancer Neg Hx   . Prostate cancer Neg Hx     Social History:  reports that he has quit smoking. He has quit using smokeless tobacco. He reports current alcohol use. He reports that he does not use drugs.  ROS: UROLOGY Frequent Urination?: No Hard to postpone urination?: No Burning/pain with urination?: No Get up at night to urinate?: No Leakage of urine?: No Urine stream starts and stops?: No Trouble starting stream?: No Do you have to strain to urinate?: No Blood in urine?: No Urinary tract infection?: No Sexually transmitted disease?: No Injury to kidneys or bladder?: No Painful intercourse?: No Weak stream?: No Erection problems?: No Penile pain?: No  Gastrointestinal Nausea?: No Vomiting?: No Indigestion/heartburn?: No Diarrhea?: No Constipation?: No  Constitutional Fever: No Night sweats?: No Weight loss?: No Fatigue?: No  Skin Skin rash/lesions?: No Itching?: No  Eyes  Blurred vision?: No Double vision?: No  Ears/Nose/Throat Sore throat?: No Sinus problems?: No  Hematologic/Lymphatic Swollen glands?: No Easy bruising?: No  Cardiovascular Leg swelling?: No Chest pain?: No  Respiratory Cough?: No Shortness of breath?: No  Endocrine Excessive thirst?: No  Musculoskeletal Back pain?: No Joint pain?: No  Neurological Headaches?: No Dizziness?: No  Psychologic Depression?: No Anxiety?: No  Physical Exam: BP (!) 152/80 (BP Location: Left  Arm, Patient Position: Sitting, Cuff Size: Normal)   Pulse 80   Ht 5\' 9"  (1.753 m)   Wt 194 lb (88 kg)   BMI 28.65 kg/m   Constitutional:  Well nourished. Alert and oriented, No acute distress. HEENT:  AT, moist mucus membranes.  Trachea midline, no masses. Cardiovascular: No clubbing, cyanosis, or edema. Respiratory: Normal respiratory effort, no increased work of breathing. GI: Abdomen is soft, non tender, non distended, no abdominal masses. Liver and spleen not palpable.  No hernias appreciated.  Stool sample for occult testing is not indicated.   GU: No CVA tenderness.  No bladder fullness or masses.  Patient with circumcised phallus.  Urethral meatus is patent.  No penile discharge. No penile lesions or rashes. Scrotum without lesions, cysts, rashes and/or edema.  Testicles are located scrotally bilaterally. No masses are appreciated in the testicles. Left and right epididymis are normal. Rectal: Patient with  normal sphincter tone. Anus and perineum without scarring or rashes. No rectal masses are appreciated. Prostate is approximately 50 grams, no nodules are appreciated. Seminal vesicles are normal. Skin: No rashes, bruises or suspicious lesions. Lymph: No inguinal adenopathy. Neurologic: Grossly intact, no focal deficits, moving all 4 extremities. Psychiatric: Normal mood and affect.  Laboratory Data: PSA   0.64 in 08/2017  Lab Results  Component Value Date   WBC 5.4 12/25/2017   HGB  17.5 12/25/2017   HCT 49.0 12/25/2017   MCV 86.0 12/25/2017   PLT 322 12/25/2017    Lab Results  Component Value Date   CREATININE 0.83 12/25/2017    No results found for: PSA  No results found for: TESTOSTERONE  No results found for: HGBA1C  No results found for: TSH  No results found for: CHOL, HDL, CHOLHDL, VLDL, LDLCALC  Lab Results  Component Value Date   AST 29 12/25/2017   Lab Results  Component Value Date   ALT 39 12/25/2017   No components found for: ALKALINEPHOPHATASE No components found for: BILIRUBINTOTAL  No results found for: ESTRADIOL  Urinalysis No results found for: COLORURINE, APPEARANCEUR, LABSPEC, PHURINE, GLUCOSEU, HGBUR, BILIRUBINUR, KETONESUR, PROTEINUR, UROBILINOGEN, NITRITE, LEUKOCYTESUR  I have reviewed the labs.   Assessment & Plan:    1. Left varicocele Explained findings to the patient Continue to manage conservatively   2. Bilateral epididymal cysts Continue to perform monthly testicular exams and report any changes RTC in one year for exam   3. Erectile dysfunction SHIM score is 15 We discussed cutting down on his alcohol consumption, but he states he would rather drink then have intercourse We discussed trying PDE5 inhibitor as his wife has been expressing her concern about the lack of intimacy in their marriage He would like to try Cialis daily - he is warned not to take the medications that contain nitrates.  I also advised him of the side effects, such as: headache, flushing, dyspepsia, abnormal vision, nasal congestion, back pain, myalgia, nausea, dizziness, and rash.  4. BPH with LUTS IPSS score is 5/1 Continue conservative management, avoiding bladder irritants and timed voiding's RTC in 12 months for IPSS, PSA and exam   Return in about 1 year (around 11/12/2019) for IPSS, SHIM, PSA and exam.  These notes generated with voice recognition software. I apologize for typographical errors.  Michiel CowboySHANNON Jennings Stirling, PA-C   [facility] [address]  [address] [place], [place] [id number] [phone]

## 2018-11-12 ENCOUNTER — Ambulatory Visit (INDEPENDENT_AMBULATORY_CARE_PROVIDER_SITE_OTHER): Payer: 59 | Admitting: Urology

## 2018-11-12 ENCOUNTER — Other Ambulatory Visit
Admission: RE | Admit: 2018-11-12 | Discharge: 2018-11-12 | Disposition: A | Discharge status: Home / Self Care | Payer: 59 | Attending: Urology | Admitting: Urology

## 2018-11-12 ENCOUNTER — Other Ambulatory Visit: Admission: RE | Admit: 2018-11-12 | Payer: 59 | Source: Home / Self Care

## 2018-11-12 ENCOUNTER — Encounter: Payer: Self-pay | Admitting: Urology

## 2018-11-12 ENCOUNTER — Other Ambulatory Visit: Payer: Self-pay

## 2018-11-12 VITALS — BP 152/80 | HR 80 | Ht 69.0 in | Wt 194.0 lb

## 2018-11-12 DIAGNOSIS — I861 Scrotal varices: Secondary | ICD-10-CM

## 2018-11-12 DIAGNOSIS — N138 Other obstructive and reflux uropathy: Secondary | ICD-10-CM

## 2018-11-12 DIAGNOSIS — N401 Enlarged prostate with lower urinary tract symptoms: Secondary | ICD-10-CM | POA: Insufficient documentation

## 2018-11-12 DIAGNOSIS — N529 Male erectile dysfunction, unspecified: Secondary | ICD-10-CM | POA: Diagnosis not present

## 2018-11-12 DIAGNOSIS — N503 Cyst of epididymis: Secondary | ICD-10-CM

## 2018-11-12 LAB — PSA: Prostatic Specific Antigen: 0.48 ng/mL (ref 0.00–4.00)

## 2018-11-12 MED ORDER — TADALAFIL 5 MG PO TABS: 5.0000 mg | ORAL_TABLET | Freq: Every day | ORAL | 6 refills | Status: DC | PRN

## 2018-11-13 ENCOUNTER — Telehealth: Payer: Self-pay

## 2018-11-13 NOTE — Telephone Encounter (Signed)
Patient notified

## 2018-11-13 NOTE — Telephone Encounter (Signed)
-----   Message from Nori Riis, PA-C sent at 11/13/2018  7:40 AM EDT ----- Please let Mr. Medinger know that his PSA was normal.

## 2019-02-21 DIAGNOSIS — R29898 Other symptoms and signs involving the musculoskeletal system: Secondary | ICD-10-CM | POA: Insufficient documentation

## 2019-02-21 DIAGNOSIS — R202 Paresthesia of skin: Secondary | ICD-10-CM | POA: Insufficient documentation

## 2019-02-21 DIAGNOSIS — M542 Cervicalgia: Secondary | ICD-10-CM | POA: Insufficient documentation

## 2019-02-21 DIAGNOSIS — R200 Anesthesia of skin: Secondary | ICD-10-CM | POA: Insufficient documentation

## 2019-11-04 ENCOUNTER — Other Ambulatory Visit: Payer: Self-pay | Admitting: Family Medicine

## 2019-11-04 DIAGNOSIS — R200 Anesthesia of skin: Secondary | ICD-10-CM

## 2019-11-04 DIAGNOSIS — M542 Cervicalgia: Secondary | ICD-10-CM

## 2019-11-04 DIAGNOSIS — R29898 Other symptoms and signs involving the musculoskeletal system: Secondary | ICD-10-CM

## 2019-11-10 NOTE — Progress Notes (Signed)
11/11/2019 3:56 PM   George Padilla 1970/03/30 [id number]  Referring provider: Jerl Mina, MD [address] [facility] [place],  [place] [address]  Chief Complaint  Patient presents with  . Follow-up    1 year follow-up    HPI: Patient is a 49 year old male who with bilateral epididymal cysts, a left varicocele and BPH with LU TS who presents today for follow up.  BPH WITH LUTS  (prostate and/or bladder) I PSS: 1/0   Previous IPSS score: 5/1      Major complaint(s):  None.  Patient denies any modifying or aggravating factors.  Patient denies any gross hematuria, dysuria or suprapubic/flank pain.  Patient denies any fevers, chills, nausea or vomiting.   He does not have a family history of PCa.   IPSS    Row Name 11/11/19 1500         International Prostate Symptom Score   How often have you had the sensation of not emptying your bladder? Not at All     How often have you had to urinate less than every two hours? Not at All     How often have you found you stopped and started again several times when you urinated? Not at All     How often have you found it difficult to postpone urination? Not at All     How often have you had a weak urinary stream? Not at All     How often have you had to strain to start urination? Not at All     How many times did you typically get up at night to urinate? 1 Time     Total IPSS Score 1       Quality of Life due to urinary symptoms   If you were to spend the rest of your life with your urinary condition just the way it is now how would you feel about that? Delighted            Score:  1-7 Mild 8-19 Moderate 20-35 Severe  Erectile dysfunction His SHIM score is 17, which is mild  ED.   His previous SHIM 15  He has been having difficulty with erections for the last several months.  His risk factors for ED are age, BPH and substance/alcohol abuse.  He denies any painful erections or curvatures with his erections.   He is  no longer having spontaneous erections.  He is not sexually active at this time.    SHIM    Row Name 11/11/19 1522         SHIM: Over the last 6 months:   How do you rate your confidence that you could get and keep an erection? Moderate     When you had erections with sexual stimulation, how often were your erections hard enough for penetration (entering your partner)? Most Times (much more than half the time)     During sexual intercourse, how often were you able to maintain your erection after you had penetrated (entered) your partner? Most Times (much more than half the time)     During sexual intercourse, how difficult was it to maintain your erection to completion of intercourse? Difficult     When you attempted sexual intercourse, how often was it satisfactory for you? Sometimes (about half the time)       SHIM Total Score   SHIM 17            Score: 1-7 Severe  ED 8-11 Moderate ED 12-16 Mild-Moderate ED 17-21 Mild ED 22-25 No ED  PMH: Past Medical History:  Diagnosis Date  . GERD (gastroesophageal reflux disease)   . Seasonal allergies     Surgical History: Past Surgical History:  Procedure Laterality Date  . FOOT SURGERY    . HERNIA REPAIR      Home Medications:  Allergies as of 11/11/2019      Reactions   Amoxicillin Itching   Bee Venom    Prednisone Swelling      Medication List       Accurate as of November 11, 2019  3:56 PM. If you have any questions, ask your nurse or doctor.        STOP taking these medications   beclomethasone 40 MCG/ACT inhaler Commonly known as: QVAR Stopped by: Lillyann Ahart, PA-C   EPINEPHrine 0.3 mg/0.3 mL Soaj injection Commonly known as: EPI-PEN Stopped by: Esha Fincher, PA-C   meloxicam 15 MG tablet Commonly known as: MOBIC Stopped by: Cong Hightower, PA-C   tadalafil 5 MG tablet Commonly known as: CIALIS Stopped by: Leonardo Plaia, PA-C   traZODone 50 MG tablet Commonly known as: DESYREL Stopped  by: Michiel Cowboy, PA-C     TAKE these medications   escitalopram 10 MG tablet Commonly known as: LEXAPRO   fluticasone 50 MCG/ACT nasal spray Commonly known as: FLONASE Place into the nose.   losartan 25 MG tablet Commonly known as: COZAAR Take by mouth.   montelukast 10 MG tablet Commonly known as: SINGULAIR Take by mouth.       Allergies:  Allergies  Allergen Reactions  . Amoxicillin Itching  . Bee Venom   . Prednisone Swelling    Family History: Family History  Problem Relation Age of Onset  . Bladder Cancer Neg Hx   . Kidney cancer Neg Hx   . Prostate cancer Neg Hx     Social History:  reports that he has quit smoking. He has quit using smokeless tobacco. He reports current alcohol use. He reports that he does not use drugs.  ROS: For pertinent review of systems please refer to history of present illness  Physical Exam: BP (!) 153/96   Pulse 75   Ht 5\' 8"  (1.727 m)   Wt 197 lb (89.4 kg)   BMI 29.95 kg/m   Constitutional:  Well nourished. Alert and oriented, No acute distress. HEENT: [place] AT, mask in place.  Trachea midline Cardiovascular: No clubbing, cyanosis, or edema. Respiratory: Normal respiratory effort, no increased work of breathing. GU: No CVA tenderness.  No bladder fullness or masses.  Patient with circumcised phallus.  Urethral meatus is patent.  No penile discharge. No penile lesions or rashes. Scrotum without lesions, cysts, rashes and/or edema.  Testicles are located scrotally bilaterally. No masses are appreciated in the testicles. Left and right epididymis are normal. Rectal: Patient with  normal sphincter tone. Anus and perineum without scarring or rashes. No rectal masses are appreciated. Prostate is approximately 50 grams, could only palpate the apex and the midportion of the gland, no nodules are appreciated. Seminal vesicles could not be palpated Skin: No rashes, bruises or suspicious lesions. Lymph: No inguinal  adenopathy. Neurologic: Grossly intact, no focal deficits, moving all 4 extremities. Psychiatric: Normal mood and affect.  Laboratory Data: PSA   0.64 in 08/2017  Lab Results  Component Value Date   WBC 5.4 12/25/2017   HGB 17.5 12/25/2017   HCT 49.0 12/25/2017   MCV 86.0 12/25/2017   PLT  322 12/25/2017    Lab Results  Component Value Date   CREATININE 0.83 12/25/2017    Lab Results  Component Value Date   AST 29 12/25/2017   Lab Results  Component Value Date   ALT 39 12/25/2017    Urinalysis No results found for: COLORURINE, APPEARANCEUR, LABSPEC, PHURINE, GLUCOSEU, HGBUR, BILIRUBINUR, KETONESUR, PROTEINUR, UROBILINOGEN, NITRITE, LEUKOCYTESUR  I have reviewed the labs.   Assessment & Plan:    1. Erectile dysfunction SHIM score is 17, it is stable  RTC in 12 months for SHIM and exam  2. BPH with LUTS IPSS score is 1/0, it is stable   Continue conservative management, avoiding bladder irritants and timed voiding's He has an appointment for his yearly physical next week and will have his PSA drawn at that time  RTC in 12 months for IPSS, PSA and exam   Return in about 1 year (around 11/10/2020) for IPSS, SHIM, PSA, UA and exam.  These notes generated with voice recognition software. I apologize for typographical errors.  Michiel Cowboy, PA-C  [facility] [address]  [address] [place], [place] [address] [phone]

## 2019-11-11 ENCOUNTER — Ambulatory Visit (INDEPENDENT_AMBULATORY_CARE_PROVIDER_SITE_OTHER): Payer: 59 | Admitting: Urology

## 2019-11-11 ENCOUNTER — Other Ambulatory Visit: Payer: Self-pay

## 2019-11-11 ENCOUNTER — Encounter: Payer: Self-pay | Admitting: Urology

## 2019-11-11 VITALS — BP 153/96 | HR 75 | Ht 68.0 in | Wt 197.0 lb

## 2019-11-11 DIAGNOSIS — N401 Enlarged prostate with lower urinary tract symptoms: Secondary | ICD-10-CM

## 2019-11-11 DIAGNOSIS — N529 Male erectile dysfunction, unspecified: Secondary | ICD-10-CM | POA: Diagnosis not present

## 2019-11-11 DIAGNOSIS — N138 Other obstructive and reflux uropathy: Secondary | ICD-10-CM

## 2019-11-18 ENCOUNTER — Ambulatory Visit: Payer: 59 | Admitting: Urology

## 2019-11-21 ENCOUNTER — Ambulatory Visit
Admission: RE | Admit: 2019-11-21 | Discharge: 2019-11-21 | Disposition: A | Discharge status: Home / Self Care | Payer: 59 | Source: Ambulatory Visit | Attending: Family Medicine | Admitting: Family Medicine

## 2019-11-21 ENCOUNTER — Ambulatory Visit: Payer: 59

## 2019-11-21 ENCOUNTER — Other Ambulatory Visit: Payer: Self-pay

## 2019-11-21 DIAGNOSIS — R200 Anesthesia of skin: Secondary | ICD-10-CM | POA: Insufficient documentation

## 2019-11-21 DIAGNOSIS — R202 Paresthesia of skin: Secondary | ICD-10-CM | POA: Insufficient documentation

## 2019-11-21 DIAGNOSIS — R29898 Other symptoms and signs involving the musculoskeletal system: Secondary | ICD-10-CM

## 2019-11-21 DIAGNOSIS — M542 Cervicalgia: Secondary | ICD-10-CM | POA: Insufficient documentation

## 2019-11-27 DIAGNOSIS — M5412 Radiculopathy, cervical region: Secondary | ICD-10-CM | POA: Insufficient documentation

## 2019-12-10 DIAGNOSIS — M4802 Spinal stenosis, cervical region: Secondary | ICD-10-CM | POA: Insufficient documentation

## 2020-05-28 ENCOUNTER — Emergency Department: Payer: Self-pay

## 2020-05-28 ENCOUNTER — Emergency Department
Admission: EM | Admit: 2020-05-28 | Discharge: 2020-05-28 | Disposition: A | Discharge status: Home / Self Care | Payer: Self-pay | Attending: Emergency Medicine | Admitting: Emergency Medicine

## 2020-05-28 ENCOUNTER — Other Ambulatory Visit: Payer: Self-pay

## 2020-05-28 DIAGNOSIS — R52 Pain, unspecified: Secondary | ICD-10-CM

## 2020-05-28 DIAGNOSIS — Z87891 Personal history of nicotine dependence: Secondary | ICD-10-CM | POA: Insufficient documentation

## 2020-05-28 DIAGNOSIS — I7300 Raynaud's syndrome without gangrene: Secondary | ICD-10-CM | POA: Insufficient documentation

## 2020-05-28 LAB — CBC WITH DIFFERENTIAL/PLATELET
Abs Immature Granulocytes: 0.02 10*3/uL (ref 0.00–0.07)
Basophils Absolute: 0 10*3/uL (ref 0.0–0.1)
Basophils Relative: 1 %
Eosinophils Absolute: 0 10*3/uL (ref 0.0–0.5)
Eosinophils Relative: 1 %
HCT: 49.1 % (ref 39.0–52.0)
Hemoglobin: 18 g/dL — ABNORMAL HIGH (ref 13.0–17.0)
Immature Granulocytes: 0 %
Lymphocytes Relative: 31 %
Lymphs Abs: 1.8 10*3/uL (ref 0.7–4.0)
MCH: 31 pg (ref 26.0–34.0)
MCHC: 36.7 g/dL — ABNORMAL HIGH (ref 30.0–36.0)
MCV: 84.5 fL (ref 80.0–100.0)
Monocytes Absolute: 0.4 10*3/uL (ref 0.1–1.0)
Monocytes Relative: 7 %
Neutro Abs: 3.5 10*3/uL (ref 1.7–7.7)
Neutrophils Relative %: 60 %
Platelets: 323 10*3/uL (ref 150–400)
RBC: 5.81 MIL/uL (ref 4.22–5.81)
RDW: 12.4 % (ref 11.5–15.5)
WBC: 5.9 10*3/uL (ref 4.0–10.5)
nRBC: 0 % (ref 0.0–0.2)

## 2020-05-28 LAB — COMPREHENSIVE METABOLIC PANEL
ALT: 28 U/L (ref 0–44)
AST: 19 U/L (ref 15–41)
Albumin: 4.2 g/dL (ref 3.5–5.0)
Alkaline Phosphatase: 49 U/L (ref 38–126)
Anion gap: 7 (ref 5–15)
BUN: 12 mg/dL (ref 6–20)
CO2: 26 mmol/L (ref 22–32)
Calcium: 9.4 mg/dL (ref 8.9–10.3)
Chloride: 105 mmol/L (ref 98–111)
Creatinine, Ser: 0.7 mg/dL (ref 0.61–1.24)
GFR, Estimated: >60 mL/min (ref >60)
Glucose, Bld: 115 mg/dL — ABNORMAL HIGH (ref 70–99)
Potassium: 4 mmol/L (ref 3.5–5.1)
Sodium: 138 mmol/L (ref 135–145)
Total Bilirubin: 1 mg/dL (ref 0.3–1.2)
Total Protein: 7.1 g/dL (ref 6.5–8.1)

## 2020-05-28 LAB — D-DIMER, QUANTITATIVE: D-Dimer, Quant: <0.27 ug/mL-FEU (ref 0.00–0.50)

## 2020-05-28 LAB — SEDIMENTATION RATE: Sed Rate: 2 mm/hr (ref 0–20)

## 2020-05-28 LAB — PROTIME-INR
INR: 1 (ref 0.8–1.2)
Prothrombin Time: 12.6 seconds (ref 11.4–15.2)

## 2020-05-28 NOTE — ED Notes (Signed)
See triage note   Presents with pain and numbness to right hand  States he has had nerve conduction studies done to same arm  Was told that his problem was in his elbow  States when his hand gets cold his fingers turn white and nailbeds are cyanotic    Skin war at present with good pulses

## 2020-05-28 NOTE — ED Provider Notes (Signed)
[facility] Emergency Department Provider Note  ____________________________________________   Event Date/Time   First MD Initiated Contact with Patient 05/28/20 0930     (approximate)  I have reviewed the triage vital signs and the nursing notes.   HISTORY  Chief Complaint bloot clot and Hand Pain   HPI George Padilla is a 50 y.o. male presents to the ED for possible blood clot in his right hand.  Patient was seen at [facility] acute care where he was told to come to the emergency department due to possible blood clot in his hand.  Patient states that for a year he has been having difficulty with his hand.  Patient states that the fingers of his right hand will change colors and become very pale in comparison with his other fingers and then gradually will return to normal.  This happened this morning while he was working which continue to concern him and he went to urgent care.  Patient is a former smoker but discontinued smoking approximately 26 years ago.  He admits to occasional alcohol and 1 cup of coffee per day.  He denies any previous injury to his right arm.  Currently rates his pain as a 3 out of 10.        Past Medical History:  Diagnosis Date  . GERD (gastroesophageal reflux disease)   . Seasonal allergies     Patient Active Problem List   Diagnosis Date Noted  . Substance induced mood disorder (HCC) 12/25/2017  . Alcohol abuse 12/25/2017  . 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 GERD (gastroesophageal reflux disease) 10/31/2017  . Seasonal allergies 10/31/2017    Past Surgical History:  Procedure Laterality Date  . FOOT SURGERY    . HERNIA REPAIR      Prior to Admission medications   Medication Sig Start Date End Date Taking? Authorizing Provider  escitalopram (LEXAPRO) 10 MG tablet  01/11/18   [provider]  fluticasone (FLONASE) 50 MCG/ACT nasal spray Place into the nose.  03/29/18   [provider]  losartan (COZAAR) 25 MG tablet Take by mouth. 03/07/18   [provider]  montelukast (SINGULAIR) 10 MG tablet Take by mouth. 10/24/18   [provider]    Allergies Amoxicillin, Bee venom, and Prednisone  Family History  Problem Relation Age of Onset  . Bladder Cancer Neg Hx   . Kidney cancer Neg Hx   . Prostate cancer Neg Hx     Social History Social History   Tobacco Use  . Smoking status: Former Games developer  . Smokeless tobacco: Former Clinical biochemist  . Vaping Use: Never used  Substance Use Topics  . Alcohol use: Yes    Comment: occasionally  . Drug use: No    Review of Systems Constitutional: No fever/chills Eyes: No visual changes. Cardiovascular: Denies chest pain. Respiratory: Denies shortness of breath. Gastrointestinal: No abdominal pain.  No nausea, no vomiting. Musculoskeletal: Intermittent painful fingers right hand only. Skin: Intermittent skin discoloration to fingers on right hand. Neurological: Negative for headaches, focal weakness or numbness. ____________________________________________   PHYSICAL EXAM:  VITAL SIGNS: ED Triage Vitals  Enc Vitals Group     BP 05/28/20 0901 (!) 162/105     Pulse Rate 05/28/20 0901 73     Resp 05/28/20 0901 16     Temp 05/28/20 0901 98.4 F (36.9 C)     Temp Source 05/28/20 0901 Oral  SpO2 05/28/20 0901 96 %     Weight 05/28/20 0921 197 lb 1.5 oz (89.4 kg)     Height 05/28/20 0921 5\' 8"  (1.727 m)     Head Circumference --      Peak Flow --      Pain Score 05/28/20 0900 3     Pain Loc --      Pain Edu? --      Excl. in GC? --     Constitutional: Alert and oriented. Well appearing and in no acute distress. Eyes: Conjunctivae are normal.  Head: Atraumatic. Neck: No stridor.   Cardiovascular: Normal rate, regular rhythm. Grossly normal heart sounds.  Good peripheral circulation. Respiratory: Normal respiratory effort.  No retractions. Lungs  CTAB. Musculoskeletal: On examination of the right hand there is no gross deformity and at this time there is no discoloration of the skin.  No pain with palpation.  Radial pulses present.  Motor sensory function intact. Neurologic:  Normal speech and language. No gross focal neurologic deficits are appreciated. No gait instability. Skin:  Skin is warm, dry and intact. No rash noted. Psychiatric: Mood and affect are normal. Speech and behavior are normal.  ____________________________________________   LABS (all labs ordered are listed, but only abnormal results are displayed)  Labs Reviewed  CBC WITH DIFFERENTIAL/PLATELET - Abnormal; Notable for the following components:      Result Value   Hemoglobin 18.0 (*)    MCHC 36.7 (*)    All other components within normal limits  COMPREHENSIVE METABOLIC PANEL - Abnormal; Notable for the following components:   Glucose, Bld 115 (*)    All other components within normal limits  D-DIMER, QUANTITATIVE  SEDIMENTATION RATE  PROTIME-INR   ____________________________________________  ____________________________________________  RADIOLOGY 07/28/20, personally viewed and evaluated these images (plain radiographs) as part of my medical decision making, as well as reviewing the written report by the radiologist.   Official radiology report(s): Beaulah Corin Venous Img Upper Uni Right(DVT)  Result Date: 05/28/2020 CLINICAL DATA:  Right upper extremity pain. EXAM: RIGHT UPPER EXTREMITY VENOUS DOPPLER ULTRASOUND TECHNIQUE: Gray-scale sonography with graded compression, as well as color Doppler and duplex ultrasound were performed to evaluate the upper extremity deep venous system from the level of the subclavian vein and including the jugular, axillary, basilic, radial, ulnar and upper cephalic vein. Spectral Doppler was utilized to evaluate flow at rest and with distal augmentation maneuvers. COMPARISON:  None. FINDINGS: Contralateral Subclavian  Vein: Respiratory phasicity is normal and symmetric with the symptomatic side. No evidence of thrombus. Normal compressibility. Internal Jugular Vein: No evidence of thrombus. Normal compressibility, respiratory phasicity and response to augmentation. Subclavian Vein: No evidence of thrombus. Normal compressibility, respiratory phasicity and response to augmentation. Axillary Vein: No evidence of thrombus. Normal compressibility, respiratory phasicity and response to augmentation. Cephalic Vein: No evidence of thrombus. Normal compressibility, respiratory phasicity and response to augmentation. Basilic Vein: No evidence of thrombus. Normal compressibility, respiratory phasicity and response to augmentation. Brachial Veins: No evidence of thrombus. Normal compressibility, respiratory phasicity and response to augmentation. Radial Veins: No evidence of thrombus. Normal compressibility, respiratory phasicity and response to augmentation. Ulnar Veins: No evidence of thrombus. Normal compressibility, respiratory phasicity and response to augmentation. Other Findings:  None visualized. IMPRESSION: No evidence of DVT within the right upper extremity. Electronically Signed   By: 07/28/2020 M.D.   On: 05/28/2020 11:09    ____________________________________________   PROCEDURES  Procedure(s) performed (including Critical Care):  Procedures   ____________________________________________  INITIAL IMPRESSION / ASSESSMENT AND PLAN / ED COURSE  As part of my medical decision making, I reviewed the following data within the electronic MEDICAL RECORD NUMBER Notes from prior ED visits and [place] Controlled Substance Database  50 year old male presents to the ED after being seen at urgent care this morning where he was told that he had blood clots in his hand and most likely to his fingers.  Patient describes what classically sounds like Raynaud's however patient refused to listen to the explanation and became angry.   Lab work and a ultrasound of the upper extremity was ordered.  After results came back and explanation was given to him, he once again made became angry.  Patient was told that he was being given information about Raynaud's for him to read.  He is to follow-up with his PCP or he can also see a vascular specialist.  Patient left angry as he was told at the urgent care that this was an emergency.  He was reassured that he did not have a DVT to his right arm.  ____________________________________________   FINAL CLINICAL IMPRESSION(S) / ED DIAGNOSES  Final diagnoses:  Pain  Raynaud's phenomenon without gangrene     ED Discharge Orders    None      *Please note:  OSSIEL MARCHIO was evaluated in Emergency Department on 05/28/2020 for the symptoms described in the history of present illness. He was evaluated in the context of the global COVID-19 pandemic, which necessitated consideration that the patient might be at risk for infection with the SARS-CoV-2 virus that causes COVID-19. Institutional protocols and algorithms that pertain to the evaluation of patients at risk for COVID-19 are in a state of rapid change based on information released by regulatory bodies including the CDC and federal and state organizations. These policies and algorithms were followed during the patient's care in the ED.  Some ED evaluations and interventions may be delayed as a result of limited staffing during and the pandemic.*   Note:  This document was prepared using Dragon voice recognition software and may include unintentional dictation errors.    Tommi Rumps, PA-C 05/28/20 1614    Shaune Pollack, MD 05/29/20 [phone]

## 2020-05-28 NOTE — ED Triage Notes (Signed)
Pt comes with c/o possible blood clot in right hand. Pt states this has been gong on for over a year. Pt states he went to UC and they felt he might have a blood clot in his hand that is starting to cause trouble with fingers.  Pt denies any blood thinners.

## 2020-06-02 ENCOUNTER — Other Ambulatory Visit: Payer: Self-pay

## 2020-06-02 ENCOUNTER — Telehealth (INDEPENDENT_AMBULATORY_CARE_PROVIDER_SITE_OTHER): Payer: Self-pay

## 2020-06-02 ENCOUNTER — Ambulatory Visit (INDEPENDENT_AMBULATORY_CARE_PROVIDER_SITE_OTHER): Payer: Self-pay | Admitting: Vascular Surgery

## 2020-06-02 ENCOUNTER — Encounter (INDEPENDENT_AMBULATORY_CARE_PROVIDER_SITE_OTHER): Payer: Self-pay | Admitting: Vascular Surgery

## 2020-06-02 VITALS — BP 140/87 | HR 65 | Ht 68.0 in | Wt 196.0 lb

## 2020-06-02 DIAGNOSIS — K219 Gastro-esophageal reflux disease without esophagitis: Secondary | ICD-10-CM

## 2020-06-02 DIAGNOSIS — I7389 Other specified peripheral vascular diseases: Secondary | ICD-10-CM

## 2020-06-02 NOTE — H&P (View-Only) (Signed)
Patient ID: George Padilla, male   DOB: [birth year], 50 y.o.   MRN: [id number]  Chief Complaint  Patient presents with  . New Patient (Initial Visit)    Referred by DR. Carew. 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 male.  I am asked to see the patient by Dr. Wallene Huh for evaluation of intermittent ischemia of his fourth and fifth digits on the right hand.  In discussions with patient, he has had some degree of ischemia to those fingers for over a year.  It has gotten most severe in the last week or 2.  He denies any open wounds or infection.  No fevers or chills.  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  He has splinter hemorrhages on the fingernail of his fourth finger on the right hand.  Cold stimuli dramatically worsens the pain and discoloration of his third fourth and fifth fingers.  The first and second fingers are spared.  He has tenderness overlying the hyperthenar area of his hand as well.  He does not smoke.  He quit many years ago.  He does not drink caffeinated beverages.  He has been wearing a glove the last few days which seems to help a little bit as does warming weather, but it even hurts in warm weather now.  He had a negative DVT study at the hospital.     Past Medical History:  Diagnosis Date  . GERD (gastroesophageal reflux disease)   . Seasonal allergies     Past Surgical History:  Procedure Laterality Date  . FOOT SURGERY    . HERNIA REPAIR       Family History  Problem Relation Age of Onset  . Bladder Cancer Neg Hx   . Kidney cancer Neg Hx   . Prostate cancer Neg Hx   No bleeding or clotting disorders   Social History   Tobacco Use  . Smoking status: Former Games developer  . Smokeless tobacco: Former Clinical biochemist  . Vaping Use: Never used  Substance Use Topics  . Alcohol use: Yes    Comment: occasionally   . 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 Amoxicillin Itching  . Bee Venom   . Prednisone Swelling    Current Outpatient Medications  Medication Sig Dispense Refill  . escitalopram (LEXAPRO) 10 MG tablet     . fluticasone (FLONASE) 50 MCG/ACT nasal spray Place into the nose.    . losartan (COZAAR) 25 MG tablet Take by mouth.    . montelukast (SINGULAIR) 10 MG tablet Take by mouth.    . esomeprazole (NEXIUM) 20 MG capsule Take by mouth.     No current facility-administered medications for this visit.      REVIEW OF SYSTEMS (Negative unless checked)  Constitutional: [] Weight loss  [] Fever  [] Chills Cardiac: [] Chest pain   [] Chest pressure   [] Palpitations   [] Shortness of breath when laying flat   [] Shortness of breath at rest   [] Shortness of breath with exertion. Vascular:  [] Pain in legs with walking   [] Pain in legs at rest   [] Pain in legs when laying flat   [] Claudication   [] Pain in feet when walking  [] Pain in feet at rest  []   Pain in feet when laying flat   [] History of DVT   [] Phlebitis   [] Swelling in legs   [] Varicose veins   [] Non-healing ulcers Pulmonary:   [] Uses home oxygen   [] Productive cough   [] Hemoptysis   [] Wheeze  [] COPD   [] Asthma Neurologic:  [] Dizziness  [] Blackouts   [] Seizures   [] History of stroke   [] History of TIA  [] Aphasia   [] Temporary blindness   [] Dysphagia   [x] Weakness or numbness in arms   [] Weakness or numbness in legs Musculoskeletal:  [] Arthritis   [] Joint swelling   [] Joint pain   [] Low back pain Hematologic:  [] Easy bruising  [] Easy bleeding   [] Hypercoagulable state   [] Anemic  [] Hepatitis Gastrointestinal:  [] Blood in stool   [] Vomiting blood  [x] Gastroesophageal reflux/heartburn   [] Abdominal pain Genitourinary:  [] Chronic kidney disease   [] Difficult urination  [] Frequent urination  [] Burning with urination   [] Hematuria Skin:  [] Rashes   [] Ulcers    [] Wounds Psychological:  [] History of anxiety   []  History of major depression.    Physical Exam BP 140/87   Pulse 65   Ht 5\' 8"  (1.727 m)   Wt 196 lb (88.9 kg)   BMI 29.80 kg/m  Gen:  WD/WN, NAD Head: [place]/AT, No temporalis wasting.  Ear/Nose/Throat: Hearing grossly intact, nares w/o erythema or drainage, oropharynx w/o Erythema/Exudate Eyes: Conjunctiva clear, sclera non-icteric  Neck: trachea midline.  No JVD.  Pulmonary:  Good air movement, respirations not labored, no use of accessory muscles  Cardiac: RRR, no JVD Vascular:  Vessel Right Left  Radial Palpable Palpable                                   Gastrointestinal:. No masses, surgical incisions, or scars. Musculoskeletal: M/S 5/5 throughout.  Splinter hemorrhage on the nail of the fourth finger on the right hand.  He has slight cyanosis of fingers 4 and 5 although it is very minimal today.  He does have tenderness overlying the hyperthenar eminence of the right hand.  No increased pulsatility in the hypothenar eminence.  There is a firm nodule in the distal portion of the hyperthenar eminence. Neurologic: Sensation grossly intact in extremities.  Symmetrical.  Speech is fluent. Motor exam as listed above. Psychiatric: Judgment intact, Mood & affect appropriate for pt's clinical situation. Dermatologic: No rashes or ulcers noted.  No cellulitis or open wounds.    Radiology Venous Img Upper Uni Right(DVT)  Result Date: 05/28/2020 CLINICAL DATA:  Right upper extremity pain. EXAM: RIGHT UPPER EXTREMITY VENOUS DOPPLER ULTRASOUND TECHNIQUE: Gray-scale sonography with graded compression, as well as color Doppler and duplex ultrasound were performed to evaluate the upper extremity deep venous system from the level of the subclavian vein and including the jugular, axillary, basilic, radial, ulnar and upper cephalic vein. Spectral Doppler was utilized to evaluate flow at rest and with distal augmentation maneuvers.  COMPARISON:  None. FINDINGS: Contralateral Subclavian Vein: Respiratory phasicity is normal and symmetric with the symptomatic side. No evidence of thrombus. Normal compressibility. Internal Jugular Vein: No evidence of thrombus. Normal compressibility, respiratory phasicity and response to augmentation. Subclavian Vein: No evidence of thrombus. Normal compressibility, respiratory phasicity and response to augmentation. Axillary Vein: No evidence of thrombus. Normal compressibility, respiratory phasicity and response to augmentation. Cephalic Vein: No evidence of thrombus. Normal compressibility, respiratory phasicity and response to augmentation. Basilic Vein: No evidence of thrombus. Normal compressibility, respiratory phasicity and response to  augmentation. Brachial Veins: No evidence of thrombus. Normal compressibility, respiratory phasicity and response to augmentation. Radial Veins: No evidence of thrombus. Normal compressibility, respiratory phasicity and response to augmentation. Ulnar Veins: No evidence of thrombus. Normal compressibility, respiratory phasicity and response to augmentation. Other Findings:  None visualized. IMPRESSION: No evidence of DVT within the right upper extremity. Electronically Signed   By: Eric  Mansell M.D.   On: 05/28/2020 11:09    Labs Recent Results (from the past 2160 hour(s))  CBC with Differential     Status: Abnormal   Collection Time: 05/28/20 10:19 AM  Result Value Ref Range   WBC 5.9 4.0 - 10.5 K/uL   RBC 5.81 4.22 - 5.81 MIL/uL   Hemoglobin 18.0 (H) 13.0 - 17.0 g/dL   HCT 49.1 39.0 - 52.0 %   MCV 84.5 80.0 - 100.0 fL   MCH 31.0 26.0 - 34.0 pg   MCHC 36.7 (H) 30.0 - 36.0 g/dL   RDW 12.4 11.5 - 15.5 %   Platelets 323 150 - 400 K/uL   nRBC 0.0 0.0 - 0.2 %   Neutrophils Relative % 60 %   Neutro Abs 3.5 1.7 - 7.7 K/uL   Lymphocytes Relative 31 %   Lymphs Abs 1.8 0.7 - 4.0 K/uL   Monocytes Relative 7 %   Monocytes Absolute 0.4 0.1 - 1.0 K/uL    Eosinophils Relative 1 %   Eosinophils Absolute 0.0 0.0 - 0.5 K/uL   Basophils Relative 1 %   Basophils Absolute 0.0 0.0 - 0.1 K/uL   Immature Granulocytes 0 %   Abs Immature Granulocytes 0.02 0.00 - 0.07 K/uL    Comment: Performed at [facility], [address] [place], [place] [address]  D-dimer, quantitative     Status: None   Collection Time: 05/28/20 10:19 AM  Result Value Ref Range   D-Dimer, Quant <0.27 0.00 - 0.50 ug/mL-FEU    Comment: (NOTE) At the manufacturer cut-off value of 0.5 g/mL FEU, this assay has a negative predictive value of 95-100%.This assay is intended for use in conjunction with a clinical pretest probability (PTP) assessment model to exclude pulmonary embolism (PE) and deep venous thrombosis (DVT) in outpatients suspected of PE or DVT. Results should be correlated with clinical presentation. Performed at [facility], [address] [place], [place] [address]   Comprehensive metabolic panel     Status: Abnormal   Collection Time: 05/28/20 10:19 AM  Result Value Ref Range   Sodium 138 135 - 145 mmol/L   Potassium 4.0 3.5 - 5.1 mmol/L   Chloride 105 98 - 111 mmol/L   CO2 26 22 - 32 mmol/L   Glucose, Bld 115 (H) 70 - 99 mg/dL    Comment: Glucose reference range applies only to samples taken after fasting for at least 8 hours.   BUN 12 6 - 20 mg/dL   Creatinine, Ser 0.70 0.61 - 1.24 mg/dL   Calcium 9.4 8.9 - 10.3 mg/dL   Total Protein 7.1 6.5 - 8.1 g/dL   Albumin 4.2 3.5 - 5.0 g/dL   AST 19 15 - 41 U/L   ALT 28 0 - 44 U/L   Alkaline Phosphatase 49 38 - 126 U/L   Total Bilirubin 1.0 0.3 - 1.2 mg/dL   GFR, Estimated >60 >60 mL/min    Comment: (NOTE) Calculated using the CKD-EPI Creatinine Equation (2021)    Anion gap 7 5 - 15    Comment: Performed at [facility], [address] [place], [place] [address]  Sedimentation rate     Status: None   Collection Time: 05/28/20 10:19 AM  Result Value Ref Range   Sed Rate 2  0 - 20 mm/hr    Comment: Performed at [facility], [address] [place], [place] [address]  Protime-INR     Status: None   Collection Time: 05/28/20 10:19 AM  Result Value Ref Range   Prothrombin Time 12.6 11.4 - 15.2 seconds   INR 1.0 0.8 - 1.2    Comment: Performed at [facility], [address] [place], [place] [address]    Assessment/Plan:  GERD (gastroesophageal reflux disease) Continue PPI as already ordered, this medication has been reviewed and there are no changes at this time. Avoidence of caffeine and alcohol which she is already doing. This was discussed also for his hand ischemia. 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  We discussed the syndrome in great detail today.  We discussed that this is a potentially digital threatening situation and definitely causes significant disability and morbidity.  We discussed conservative measures that he has already undertaken which would include avoiding caffeine.  He quit smoking many years ago.  We also discussed trying to avoid cold stimuli which exacerbates his situation.  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  The next step in the diagnosis and treatment algorithm would be an angiogram of the right upper extremity.  We discussed if he has ulnar thrombosis, we may consider catheter-based thrombolytic therapy.  I did discuss that if he had an aneurysm or needed an open surgical reconstruction, we would refer him to a hand specialist for the situation.  He voices his understanding and is agreeable to proceed with right upper extremity angiogram.      Festus Barren 06/02/2020, 9:47 AM   This note was created with Dragon medical transcription system.  Any errors from dictation are unintentional.

## 2020-06-02 NOTE — Assessment & Plan Note (Signed)
The patient has a very classic history and physical examination for hyperthenar hammer syndrome.  We discussed the syndrome in great detail today.  We discussed that this is a potentially digital threatening situation and definitely causes significant disability and morbidity.  We discussed conservative measures that he has already undertaken which would include avoiding caffeine.  He quit smoking many years ago.  We also discussed trying to avoid cold stimuli which exacerbates his situation.  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  The next step in the diagnosis and treatment algorithm would be an angiogram of the right upper extremity.  We discussed if he has ulnar thrombosis, we may consider catheter-based thrombolytic therapy.  I did discuss that if he had an aneurysm or needed an open surgical reconstruction, we would refer him to a hand specialist for the situation.  He voices his understanding and is agreeable to proceed with right upper extremity angiogram.

## 2020-06-02 NOTE — Assessment & Plan Note (Signed)
Continue PPI as already ordered, this medication has been reviewed and there are no changes at this time. Avoidence of caffeine and alcohol which she is already doing. This was discussed also for his hand ischemia. Moderate elevation of the head of the bed

## 2020-06-02 NOTE — Patient Instructions (Signed)
Angiogram  An angiogram is a procedure used to examine the blood vessels. In this procedure, contrast dye is injected through a long, thin tube (catheter) into an artery. X-rays are then taken, which show if there is a blockage or problem in a blood vessel. The catheter may be inserted in:  The groin area. This is the most common.  The fold of the arm, near the elbow.  The wrist. Tell a health care provider about:  Any allergies you have, including allergies to medicines, shellfish, contrast dye, or iodine.  All medicines you are taking, including vitamins, herbs, eye drops, creams, and over-the-counter medicines.  Any problems you or family members have had with anesthetic medicines.  Any blood disorders you have.  Any surgeries you have had.  Any medical conditions you have or have had, including any kidney problems or kidney failure.  Whether you are pregnant or may be pregnant.  Whether you are breastfeeding. What are the risks? Generally, this is a safe procedure. However, problems may occur, including:  Infection.  Bleeding and bruising.  Allergic reactions to medicines or dyes, including the contrast dye used.  Damage to nearby structures or organs, including damage to blood vessels and kidney damage from the contrast dye.  Blood clots that can lead to a stroke or heart attack. What happens before the procedure? Staying hydrated Follow instructions from your health care provider about hydration, which may include:  Up to 2 hours before the procedure - you may continue to drink clear liquids, such as water, clear fruit juice, black coffee, and plain tea.   Eating and drinking restrictions Follow instructions from your health care provider about eating and drinking, which may include:  8 hours before the procedure - stop eating heavy meals or foods, such as meat, fried foods, or fatty foods.  6 hours before the procedure - stop eating light meals or foods, such  as toast or cereal.  2 hours before the procedure - stop drinking clear liquids. Medicines Ask your health care provider about:  Changing or stopping your regular medicines. This is especially important if you are taking diabetes medicines or blood thinners.  Taking medicines such as aspirin and ibuprofen. These medicines can thin your blood. Do not take these medicines unless your health care provider tells you to take them.  Taking over-the-counter medicines, vitamins, herbs, and supplements. Surgery safety Ask your health care provider:  How your insertion site will be marked.  What steps will be taken to help prevent infection. These may include: ? Removing hair at the insertion site. ? Washing skin with a germ-killing soap. ? Taking antibiotic medicine. General instructions  Do not use any products that contain nicotine or tobacco for at least 4 weeks before the procedure. These products include cigarettes, e-cigarettes, and chewing tobacco. If you need help quitting, ask your health care provider.  You may have blood samples taken.  Plan to have someone take you home from the hospital or clinic.  If you will be going home right after the procedure, plan to have someone with you for 24 hours. What happens during the procedure?  You will lie on your back on an X-ray table. You may be strapped to the table if it is tilted.  An IV will be inserted into one of your veins.  Electrodes may be placed on your chest to monitor your heart rate during the procedure.  You will be given one or both of the following: ? A medicine   to help you relax (sedative). ? A medicine to numb the area where the catheter will be inserted (local anesthetic).  A small incision will be made for catheter insertion.  The catheter will be inserted into an artery using a guide wire. An X-ray procedure (fluoroscopy) will be used to help guide the catheter to the blood vessel to be examined.  A contrast  dye will then be injected into the catheter, and X-rays will be taken. The contrast will help to show where any narrowing or blockages are located in the blood vessels. You may feel flushed as the contrast dye is injected.  Tell your health care provider if you develop chest pain or trouble breathing.  After the fluoroscopy is complete, the catheter will be removed.  A bandage (dressing) will be placed over the site where the catheter was inserted. Pressure will be applied to help stop any bleeding.  The IV will be removed. The procedure may vary among health care providers and hospitals. What happens after the procedure?  Your blood pressure, heart rate, breathing rate, and blood oxygen level will be monitored until you leave the hospital or clinic.  You will be kept in bed lying flat for 6 hours. If the catheter was inserted through your leg, you will be instructed not to bend or cross your legs.  The insertion area and the pulse in your feet or wrist will be checked frequently.  You will be instructed to drink plenty of fluids. This will help wash the contrast dye out of your body.  You may have more blood tests and X-rays. You may also have a test that records the electrical activity of your heart (electrocardiogram, or ECG).  Do not drive for 24 hours if you were given a sedative during your procedure.  It is up to you to get the results of your procedure. Ask your health care provider, or the department that is doing the procedure, when your results will be ready. Summary  An angiogram is a procedure used to examine the blood vessels.  Before the procedure, follow your health care provider's instructions about eating and drinking restrictions. You may be asked to stop eating and drinking several hours before the procedure.  During the procedure, contrast dye is injected through a thin tube (catheter) into an artery. X-rays are then taken.  After the procedure, you will need to  drink plenty of fluids and lie flat for 6 hour. This information is not intended to replace advice given to you by your health care provider. Make sure you discuss any questions you have with your health care provider. Document Revised: 09/19/2018 Document Reviewed: 09/19/2018 Elsevier Patient Education  2021 Elsevier Inc.  

## 2020-06-02 NOTE — Progress Notes (Signed)
Patient ID: George Padilla, male   DOB: [birth year], 50 y.o.   MRN: [id number]  Chief Complaint  Patient presents with  . New Patient (Initial Visit)    Referred by George Padilla. 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 male.  I am asked to see the patient by George Padilla for evaluation of intermittent ischemia of his fourth and fifth digits on the right hand.  In discussions with patient, he has had some degree of ischemia to those fingers for over a year.  It has gotten most severe in the last week or 2.  He denies any open wounds or infection.  No fevers or chills.  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  He has splinter hemorrhages on the fingernail of his fourth finger on the right hand.  Cold stimuli dramatically worsens the pain and discoloration of his third fourth and fifth fingers.  The first and second fingers are spared.  He has tenderness overlying the hyperthenar area of his hand as well.  He does not smoke.  He quit many years ago.  He does not drink caffeinated beverages.  He has been wearing a glove the last few days which seems to help a little bit as does warming weather, but it even hurts in warm weather now.  He had a negative DVT study at the hospital.     Past Medical History:  Diagnosis Date  . GERD (gastroesophageal reflux disease)   . Seasonal allergies     Past Surgical History:  Procedure Laterality Date  . FOOT SURGERY    . HERNIA REPAIR       Family History  Problem Relation Age of Onset  . Bladder Cancer Neg Hx   . Kidney cancer Neg Hx   . Prostate cancer Neg Hx   No bleeding or clotting disorders   Social History   Tobacco Use  . Smoking status: Former George Padilla  . Smokeless tobacco: Former George Padilla  . Vaping Use: Never used  Substance Use Topics  . Alcohol use: Yes    Comment: occasionally   . 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 Amoxicillin Itching  . Bee Venom   . Prednisone Swelling    Current Outpatient Medications  Medication Sig Dispense Refill  . escitalopram (LEXAPRO) 10 MG tablet     . fluticasone (FLONASE) 50 MCG/ACT nasal spray Place into the nose.    . losartan (COZAAR) 25 MG tablet Take by mouth.    . montelukast (SINGULAIR) 10 MG tablet Take by mouth.    . esomeprazole (NEXIUM) 20 MG capsule Take by mouth.     No current facility-administered medications for this visit.      REVIEW OF SYSTEMS (Negative unless checked)  Constitutional: [] Weight loss  [] Fever  [] Chills Cardiac: [] Chest pain   [] Chest pressure   [] Palpitations   [] Shortness of breath when laying flat   [] Shortness of breath at rest   [] Shortness of breath with exertion. Vascular:  [] Pain in legs with walking   [] Pain in legs at rest   [] Pain in legs when laying flat   [] Claudication   [] Pain in feet when walking  [] Pain in feet at rest  []   Pain in feet when laying flat   [] History of DVT   [] Phlebitis   [] Swelling in legs   [] Varicose veins   [] Non-healing ulcers Pulmonary:   [] Uses home oxygen   [] Productive cough   [] Hemoptysis   [] Wheeze  [] COPD   [] Asthma Neurologic:  [] Dizziness  [] Blackouts   [] Seizures   [] History of stroke   [] History of TIA  [] Aphasia   [] Temporary blindness   [] Dysphagia   [x] Weakness or numbness in arms   [] Weakness or numbness in legs Musculoskeletal:  [] Arthritis   [] Joint swelling   [] Joint pain   [] Low back pain Hematologic:  [] Easy bruising  [] Easy bleeding   [] Hypercoagulable state   [] Anemic  [] Hepatitis Gastrointestinal:  [] Blood in stool   [] Vomiting blood  [x] Gastroesophageal reflux/heartburn   [] Abdominal pain Genitourinary:  [] Chronic kidney disease   [] Difficult urination  [] Frequent urination  [] Burning with urination   [] Hematuria Skin:  [] Rashes   [] Ulcers    [] Wounds Psychological:  [] History of anxiety   []  History of major depression.    Physical Exam BP 140/87   Pulse 65   Ht 5\' 8"  (1.727 m)   Wt 196 lb (88.9 kg)   BMI 29.80 kg/m  Gen:  WD/WN, NAD Head: [place]/AT, No temporalis wasting.  Ear/Nose/Throat: Hearing grossly intact, nares w/o erythema or drainage, oropharynx w/o Erythema/Exudate Eyes: Conjunctiva clear, sclera non-icteric  Neck: trachea midline.  No JVD.  Pulmonary:  Good air movement, respirations not labored, no use of accessory muscles  Cardiac: RRR, no JVD Vascular:  Vessel Right Left  Radial Palpable Palpable                                   Gastrointestinal:. No masses, surgical incisions, or scars. Musculoskeletal: M/S 5/5 throughout.  Splinter hemorrhage on the nail of the fourth finger on the right hand.  He has slight cyanosis of fingers 4 and 5 although it is very minimal today.  He does have tenderness overlying the hyperthenar eminence of the right hand.  No increased pulsatility in the hypothenar eminence.  There is a firm nodule in the distal portion of the hyperthenar eminence. Neurologic: Sensation grossly intact in extremities.  Symmetrical.  Speech is fluent. Motor exam as listed above. Psychiatric: Judgment intact, Mood & affect appropriate for pt's George situation. Dermatologic: No rashes or ulcers noted.  No cellulitis or open wounds.    Radiology Venous Img Upper Uni Right(DVT)  Result Date: 05/28/2020 George DATA:  Right upper extremity pain. EXAM: RIGHT UPPER EXTREMITY VENOUS DOPPLER ULTRASOUND TECHNIQUE: Gray-scale sonography with graded compression, as well as color Doppler and duplex ultrasound were performed to evaluate the upper extremity deep venous system from the level of the subclavian vein and including the jugular, axillary, basilic, radial, ulnar and upper cephalic vein. Spectral Doppler was utilized to evaluate flow at rest and with distal augmentation maneuvers.  COMPARISON:  None. FINDINGS: Contralateral Subclavian Vein: Respiratory phasicity is normal and symmetric with the symptomatic side. No evidence of thrombus. Normal compressibility. Internal Jugular Vein: No evidence of thrombus. Normal compressibility, respiratory phasicity and response to augmentation. Subclavian Vein: No evidence of thrombus. Normal compressibility, respiratory phasicity and response to augmentation. Axillary Vein: No evidence of thrombus. Normal compressibility, respiratory phasicity and response to augmentation. Cephalic Vein: No evidence of thrombus. Normal compressibility, respiratory phasicity and response to augmentation. Basilic Vein: No evidence of thrombus. Normal compressibility, respiratory phasicity and response to  augmentation. Brachial Veins: No evidence of thrombus. Normal compressibility, respiratory phasicity and response to augmentation. Radial Veins: No evidence of thrombus. Normal compressibility, respiratory phasicity and response to augmentation. Ulnar Veins: No evidence of thrombus. Normal compressibility, respiratory phasicity and response to augmentation. Other Findings:  None visualized. IMPRESSION: No evidence of DVT within the right upper extremity. Electronically Signed   By: Kennith CenterEric  Mansell M.D.   On: 05/28/2020 11:09    Labs Recent Results (from the past 2160 hour(s))  CBC with Differential     Status: Abnormal   Collection Time: 05/28/20 10:19 AM  Result Value Ref Range   WBC 5.9 4.0 - 10.5 K/uL   RBC 5.81 4.22 - 5.81 MIL/uL   Hemoglobin 18.0 (H) 13.0 - 17.0 g/dL   HCT 16.149.1 09.639.0 - 04.552.0 %   MCV 84.5 80.0 - 100.0 fL   MCH 31.0 26.0 - 34.0 pg   MCHC 36.7 (H) 30.0 - 36.0 g/dL   RDW 40.912.4 81.111.5 - 91.415.5 %   Platelets 323 150 - 400 K/uL   nRBC 0.0 0.0 - 0.2 %   Neutrophils Relative % 60 %   Neutro Abs 3.5 1.7 - 7.7 K/uL   Lymphocytes Relative 31 %   Lymphs Abs 1.8 0.7 - 4.0 K/uL   Monocytes Relative 7 %   Monocytes Absolute 0.4 0.1 - 1.0 K/uL    Eosinophils Relative 1 %   Eosinophils Absolute 0.0 0.0 - 0.5 K/uL   Basophils Relative 1 %   Basophils Absolute 0.0 0.0 - 0.1 K/uL   Immature Granulocytes 0 %   Abs Immature Granulocytes 0.02 0.00 - 0.07 K/uL    Comment: Performed at [facility], [address] [place], [place] [address]  D-dimer, quantitative     Status: None   Collection Time: 05/28/20 10:19 AM  Result Value Ref Range   D-Dimer, Quant <0.27 0.00 - 0.50 ug/mL-FEU    Comment: (NOTE) At the manufacturer cut-off value of 0.5 g/mL FEU, this assay has a negative predictive value of 95-100%.This assay is intended for use in conjunction with a George pretest probability (PTP) assessment model to exclude pulmonary embolism (PE) and deep venous thrombosis (DVT) in outpatients suspected of PE or DVT. Results should be correlated with George presentation. Performed at [facility], [address] [place], [place] [address]   Comprehensive metabolic panel     Status: Abnormal   Collection Time: 05/28/20 10:19 AM  Result Value Ref Range   Sodium 138 135 - 145 mmol/L   Potassium 4.0 3.5 - 5.1 mmol/L   Chloride 105 98 - 111 mmol/L   CO2 26 22 - 32 mmol/L   Glucose, Bld 115 (H) 70 - 99 mg/dL    Comment: Glucose reference range applies only to samples taken after fasting for at least 8 hours.   BUN 12 6 - 20 mg/dL   Creatinine, Ser 8.650.70 0.61 - 1.24 mg/dL   Calcium 9.4 8.9 - 78.410.3 mg/dL   Total Protein 7.1 6.5 - 8.1 g/dL   Albumin 4.2 3.5 - 5.0 g/dL   AST 19 15 - 41 U/L   ALT 28 0 - 44 U/L   Alkaline Phosphatase 49 38 - 126 U/L   Total Bilirubin 1.0 0.3 - 1.2 mg/dL   GFR, Estimated >69>60 >62>60 mL/min    Comment: (NOTE) Calculated using the CKD-EPI Creatinine Equation (2021)    Anion gap 7 5 - 15    Comment: Performed at [facility], [address] [place], [place] [address]  Sedimentation rate     Status: None   Collection Time: 05/28/20 10:19 AM  Result Value Ref Range   Sed Rate 2  0 - 20 mm/hr    Comment: Performed at [facility], [address] [place], [place] [address]  Protime-INR     Status: None   Collection Time: 05/28/20 10:19 AM  Result Value Ref Range   Prothrombin Time 12.6 11.4 - 15.2 seconds   INR 1.0 0.8 - 1.2    Comment: Performed at [facility], [address] [place], [place] [address]    Assessment/Plan:  GERD (gastroesophageal reflux disease) Continue PPI as already ordered, this medication has been reviewed and there are no changes at this time. Avoidence of caffeine and alcohol which she is already doing. This was discussed also for his hand ischemia. 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  We discussed the syndrome in great detail today.  We discussed that this is a potentially digital threatening situation and definitely causes significant disability and morbidity.  We discussed conservative measures that he has already undertaken which would include avoiding caffeine.  He quit smoking many years ago.  We also discussed trying to avoid cold stimuli which exacerbates his situation.  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  The next step in the diagnosis and treatment algorithm would be an angiogram of the right upper extremity.  We discussed if he has ulnar thrombosis, we may consider catheter-based thrombolytic therapy.  I did discuss that if he had an aneurysm or needed an open surgical reconstruction, we would refer him to a hand specialist for the situation.  He voices his understanding and is agreeable to proceed with right upper extremity angiogram.      Festus Barren 06/02/2020, 9:47 AM   This note was created with Dragon medical transcription system.  Any errors from dictation are unintentional.

## 2020-06-02 NOTE — Telephone Encounter (Signed)
Patient was seen in office and scheduled for a right arm angio on 06/08/20 with a 7:45 am arrival time to the [facility] with Dr. Wyn Quaker. Covid testing on 06/04/20 between 8-2 pm at the [facility]. Pre-procedure instructions were discussed and handed to the patient.

## 2020-06-04 ENCOUNTER — Other Ambulatory Visit
Admission: RE | Admit: 2020-06-04 | Discharge: 2020-06-04 | Disposition: A | Discharge status: Home / Self Care | Payer: Self-pay | Source: Ambulatory Visit | Attending: Vascular Surgery | Admitting: Vascular Surgery

## 2020-06-04 ENCOUNTER — Other Ambulatory Visit: Payer: Self-pay

## 2020-06-04 DIAGNOSIS — Z20822 Contact with and (suspected) exposure to covid-19: Secondary | ICD-10-CM | POA: Insufficient documentation

## 2020-06-04 DIAGNOSIS — Z01812 Encounter for preprocedural laboratory examination: Secondary | ICD-10-CM | POA: Insufficient documentation

## 2020-06-05 LAB — SARS CORONAVIRUS 2 (TAT 6-24 HRS): SARS Coronavirus 2: NEGATIVE

## 2020-06-08 ENCOUNTER — Other Ambulatory Visit (INDEPENDENT_AMBULATORY_CARE_PROVIDER_SITE_OTHER): Payer: Self-pay | Admitting: Nurse Practitioner

## 2020-06-08 ENCOUNTER — Ambulatory Visit
Admission: RE | Admit: 2020-06-08 | Discharge: 2020-06-08 | Disposition: A | Discharge status: Home / Self Care | Payer: Self-pay | Attending: Vascular Surgery | Admitting: Vascular Surgery

## 2020-06-08 ENCOUNTER — Encounter
Admission: RE | Disposition: A | Discharge status: Home / Self Care | Payer: Self-pay | Source: Home / Self Care | Attending: Vascular Surgery

## 2020-06-08 ENCOUNTER — Other Ambulatory Visit: Payer: Self-pay

## 2020-06-08 ENCOUNTER — Encounter: Payer: Self-pay | Admitting: Vascular Surgery

## 2020-06-08 DIAGNOSIS — Z87891 Personal history of nicotine dependence: Secondary | ICD-10-CM | POA: Insufficient documentation

## 2020-06-08 DIAGNOSIS — Z888 Allergy status to other drugs, medicaments and biological substances status: Secondary | ICD-10-CM | POA: Insufficient documentation

## 2020-06-08 DIAGNOSIS — I7389 Other specified peripheral vascular diseases: Secondary | ICD-10-CM

## 2020-06-08 DIAGNOSIS — Z79899 Other long term (current) drug therapy: Secondary | ICD-10-CM | POA: Insufficient documentation

## 2020-06-08 DIAGNOSIS — Z880 Allergy status to penicillin: Secondary | ICD-10-CM | POA: Insufficient documentation

## 2020-06-08 HISTORY — PX: UPPER EXTREMITY ANGIOGRAPHY: CATH118270

## 2020-06-08 LAB — CREATININE, SERUM
Creatinine, Ser: 0.98 mg/dL (ref 0.61–1.24)
GFR, Estimated: >60 mL/min (ref >60)

## 2020-06-08 LAB — BUN: BUN: 12 mg/dL (ref 6–20)

## 2020-06-08 SURGERY — UPPER EXTREMITY ANGIOGRAPHY
Anesthesia: Moderate Sedation | Laterality: Right

## 2020-06-08 MED ORDER — ALTEPLASE 2 MG IJ SOLR
INTRAMUSCULAR | Status: DC | PRN
  Administered 2020-06-08: 4 mg

## 2020-06-08 MED ORDER — NITROGLYCERIN 1 MG/10 ML FOR IR/CATH LAB
INTRA_ARTERIAL | Status: DC | PRN
  Administered 2020-06-08: 300 ug
  Administered 2020-06-08: 200 ug

## 2020-06-08 MED ORDER — CLINDAMYCIN PHOSPHATE 300 MG/50ML IV SOLN: 300.0000 mg | Freq: Once | INTRAVENOUS | Status: AC

## 2020-06-08 MED ORDER — FENTANYL CITRATE (PF) 100 MCG/2ML IJ SOLN
INTRAMUSCULAR | Status: AC
  Filled 2020-06-08: qty 2

## 2020-06-08 MED ORDER — ASPIRIN EC 81 MG PO TBEC
81.0000 mg | DELAYED_RELEASE_TABLET | Freq: Every day | ORAL | 2 refills | Status: DC
Start: 2020-06-08 — End: 2023-08-01

## 2020-06-08 MED ORDER — SODIUM CHLORIDE 0.9 % IV SOLN: INTRAVENOUS | Status: DC

## 2020-06-08 MED ORDER — HEPARIN SODIUM (PORCINE) 1000 UNIT/ML IJ SOLN
INTRAMUSCULAR | Status: DC | PRN
  Administered 2020-06-08: 2000 [IU] via INTRAVENOUS
  Administered 2020-06-08: 3000 [IU] via INTRAVENOUS

## 2020-06-08 MED ORDER — DIPHENHYDRAMINE HCL 50 MG/ML IJ SOLN: 50.0000 mg | Freq: Once | INTRAMUSCULAR | Status: DC | PRN

## 2020-06-08 MED ORDER — MIDAZOLAM HCL 2 MG/2ML IJ SOLN
INTRAMUSCULAR | Status: DC | PRN
  Administered 2020-06-08: 2 mg via INTRAVENOUS
  Administered 2020-06-08: 1 mg via INTRAVENOUS
  Administered 2020-06-08: 2 mg via INTRAVENOUS

## 2020-06-08 MED ORDER — FAMOTIDINE 20 MG PO TABS: 40.0000 mg | ORAL_TABLET | Freq: Once | ORAL | Status: DC | PRN

## 2020-06-08 MED ORDER — ONDANSETRON HCL 4 MG/2ML IJ SOLN: 4.0000 mg | Freq: Four times a day (QID) | INTRAMUSCULAR | Status: DC | PRN

## 2020-06-08 MED ORDER — ALTEPLASE 2 MG IJ SOLR
INTRAMUSCULAR | Status: AC
  Filled 2020-06-08: qty 4

## 2020-06-08 MED ORDER — MIDAZOLAM HCL 2 MG/ML PO SYRP: 8.0000 mg | ORAL_SOLUTION | Freq: Once | ORAL | Status: DC | PRN

## 2020-06-08 MED ORDER — FENTANYL CITRATE (PF) 100 MCG/2ML IJ SOLN
INTRAMUSCULAR | Status: DC | PRN
  Administered 2020-06-08 (×3): 50 ug via INTRAVENOUS

## 2020-06-08 MED ORDER — IODIXANOL 320 MG/ML IV SOLN
INTRAVENOUS | Status: DC | PRN
  Administered 2020-06-08: 55 mL

## 2020-06-08 MED ORDER — HEPARIN SODIUM (PORCINE) 1000 UNIT/ML IJ SOLN
INTRAMUSCULAR | Status: AC
  Filled 2020-06-08: qty 1

## 2020-06-08 MED ORDER — HYDROMORPHONE HCL 1 MG/ML IJ SOLN: 1.0000 mg | Freq: Once | INTRAMUSCULAR | Status: DC | PRN

## 2020-06-08 MED ORDER — MIDAZOLAM HCL 5 MG/5ML IJ SOLN
INTRAMUSCULAR | Status: AC
  Filled 2020-06-08: qty 5

## 2020-06-08 MED ORDER — CLINDAMYCIN PHOSPHATE 300 MG/50ML IV SOLN
INTRAVENOUS | Status: AC
  Administered 2020-06-08: 300 mg via INTRAVENOUS
  Filled 2020-06-08: qty 50

## 2020-06-08 MED ORDER — NITROGLYCERIN 1 MG/10 ML FOR IR/CATH LAB
INTRA_ARTERIAL | Status: AC
  Filled 2020-06-08: qty 10

## 2020-06-08 SURGICAL SUPPLY — 18 items
ADPR CATH 9FR SLF ADJ SL SD (SHEATH) ×1
CANISTER PENUMBRA ENGINE (MISCELLANEOUS) ×2 IMPLANT
CATH ANGIO 5F PIGTAIL 100CM (CATHETERS) ×2 IMPLANT
CATH CXI SUPP 2.6F 150 ST (CATHETERS) ×2 IMPLANT
CATH HEADHUNTER H1 5F 100CM (CATHETERS) ×2 IMPLANT
CATH INDIGO CAT3 KIT (CATHETERS) ×2 IMPLANT
CATH NAVICROSS ANGLED 135CM (MICROCATHETER) ×2 IMPLANT
COVER EZ STRL 42X30 (DRAPES) ×2 IMPLANT
DEVICE STARCLOSE SE CLOSURE (Vascular Products) ×2 IMPLANT
DEVICE TORQUE (MISCELLANEOUS) ×2 IMPLANT
GLIDEWIRE ADV .035X260CM (WIRE) ×2 IMPLANT
GLIDEWIRE ANGLED SS 035X260CM (WIRE) ×2 IMPLANT
GUIDEWIRE PFTE-COATED .018X300 (WIRE) ×2 IMPLANT
SHEATH BRITE TIP 5FRX11 (SHEATH) ×2 IMPLANT
SHEATH SHUTTLE 6FR (SHEATH) ×2 IMPLANT
TUBING CONTRAST HIGH PRESS 72 (TUBING) ×2 IMPLANT
VALVE CHECKFLO PERFORMER (SHEATH) ×2 IMPLANT
WIRE GUIDERIGHT .035X150 (WIRE) ×2 IMPLANT

## 2020-06-08 NOTE — Interval H&P Note (Signed)
History and Physical Interval Note:  06/08/2020 9:13 AM  George Padilla  has presented today for surgery, with the diagnosis of RT arm angio   Hypothenar hammer syndrome Covid  March 17.  The various methods of treatment have been discussed with the patient and family. After consideration of risks, benefits and other options for treatment, the patient has consented to  Procedure(s): UPPER EXTREMITY ANGIOGRAPHY (Right) as a surgical intervention.  The patient's history has been reviewed, patient examined, no change in status, stable for surgery.  I have reviewed the patient's chart and labs.  Questions were answered to the patient's satisfaction.     Festus Barren

## 2020-06-08 NOTE — Op Note (Signed)
OPERATIVE REPORT     PREOPERATIVE DIAGNOSIS: 1. Hypothenar hammer syndrome right hand   POSTOPERATIVE DIAGNOSIS: Same as above   PROCEDURE PERFORMED: 1. Ultrasound guidance vascular access to right femoral artery. 2. Catheter placement to right ulnar artery     from right femoral approach. 3. Thoracic aortogram and selective right upper extremity angiogram     including selective images of the ulnar artery. 4.  Catheter directed thrombolytic therapy with 4 mg of TPA into the distal right ulnar artery 5.  Mechanical thrombectomy with the penumbra CAT 3 catheter to the right distal ulnar artery 6. StarClose closure device right femoral artery.   SURGEON:  Annice Needy, MD   ANESTHESIA:  Local with moderate conscious sedation for 79 minutes using 5 mg of Versed and 150 mcg of Fentanyl   BLOOD LOSS:  Minimal.   FLUOROSCOPY TIME: 19.2   INDICATION FOR PROCEDURE:  This is a 49 y.o.male who presented to our office with hyperthenar hammer syndrome.  The patient has had some chronic ischemia with worsening symptoms over the past several weeks.  To further evaluate this to determine what options would be possible to treat, angiogram of the left upper extremity is indicated.  Risks and benefits are discussed.  Informed consent was obtained.   DESCRIPTION OF PROCEDURE:  The patient was brought to the vascular suite.  Moderate conscious sedation was administered during a face to face encounter with the patient throughout the procedure with my supervision of the RN administering medicines and monitoring the patient's vital signs, pulse oximetry, telemetry and mental status throughout from the start of the procedure until the patient was taken to the recovery room.  Groins were shaved and prepped and sterile surgical field was created.  The right femoral head was localized with fluoroscopy and the right femoral artery was then visualized with ultrasound and found to be widely patent.  It was then  accessed under direct ultrasound guidance without difficulty with a Seldinger needle and a permanent image was recorded.  A J-wire and 5-French sheath were then placed.  Pigtail catheter was placed into the ascending aorta and a thoracic aortogram was then performed in the LAO projection. This demonstrated normal origins to the great vessels without significant proximal stenoses and a normal configuration of the great vessels.  The patient was given 3000 units of intravenous heparin and a headhunter catheter was used to selectively cannulate the innominate artery than the right subclavian artery without difficulty.  He had a very high takeoff of his radial and ulnar arteries at the level of the axillary artery.  The ulnar artery was the larger of the 2 arteries, but the radial artery was a decent sized vessel as well.  We were interrogating ulnar artery so advanced a headhunter catheter and then exchanged for a CXI catheter in the ulnar artery and advanced this down to the distal forearm.  Imaging showed the radial artery to be large and continuous all the way into the hand and did have some cross-filling into digits 4 and 5. The ulnar artery was fairly normal down to the distal wrist where it abruptly occluded and lots of collaterals fed the hand in a delayed fashion.  I then gave another 2000 units of heparin and upsized to a 6 [place] sheath.  A long CXI catheter of 0.018 advantage wire were then taken down to the level of the occlusion.  Once I got the catheter into the occlusion, I gave 4 mg of TPA through  the catheter to perform catheter directed thrombolytic therapy.  After this dwelled for several minutes, I used the penumbra CAT 3 catheter in the distal ulnar artery in the hand and wrist area and perform mechanical thrombectomy.  3 passes were made with the penumbra CAT 3 device.  Although this was the small thrombectomy catheter, it was the only catheter we had that was long enough to get all the way  into the ulnar artery at the hand.  Even with multiple passes and the catheter directed thrombolytic therapy, no significant improvement was seen in the occlusion of the ulnar artery at the hand and wrist location.  Intra-arterial nitroglycerin was given when there appeared to be a significant component of spasm, and that did help the spasm but the occlusion remained.  After multiple attempts, it was clear that this was not going to be a lesion that was amenable to endovascular therapy consideration for a distal hand bypass will have to be given.  The diagnostic catheter was removed.  Oblique arteriogram was performed of the right femoral artery and StarClose closure device deployed in the usual fashion with excellent hemostatic result. The patient tolerated the procedure well and was taken to the recovery room in stable condition.    Festus Barren 06/08/2020 11:03 AM

## 2020-11-01 ENCOUNTER — Other Ambulatory Visit: Payer: Self-pay

## 2020-11-01 ENCOUNTER — Encounter: Payer: Self-pay | Admitting: Emergency Medicine

## 2020-11-01 ENCOUNTER — Ambulatory Visit
Admission: EM | Admit: 2020-11-01 | Discharge: 2020-11-02 | Disposition: A | Discharge status: Home / Self Care | Payer: Self-pay | Attending: Gastroenterology | Admitting: Gastroenterology

## 2020-11-01 DIAGNOSIS — R1319 Other dysphagia: Secondary | ICD-10-CM

## 2020-11-01 DIAGNOSIS — T18128A Food in esophagus causing other injury, initial encounter: Secondary | ICD-10-CM

## 2020-11-01 DIAGNOSIS — Z87891 Personal history of nicotine dependence: Secondary | ICD-10-CM | POA: Insufficient documentation

## 2020-11-01 DIAGNOSIS — Z79899 Other long term (current) drug therapy: Secondary | ICD-10-CM | POA: Insufficient documentation

## 2020-11-01 DIAGNOSIS — Z20822 Contact with and (suspected) exposure to covid-19: Secondary | ICD-10-CM | POA: Insufficient documentation

## 2020-11-01 DIAGNOSIS — R0902 Hypoxemia: Secondary | ICD-10-CM

## 2020-11-01 DIAGNOSIS — Z91030 Bee allergy status: Secondary | ICD-10-CM | POA: Insufficient documentation

## 2020-11-01 DIAGNOSIS — K222 Esophageal obstruction: Secondary | ICD-10-CM | POA: Insufficient documentation

## 2020-11-01 DIAGNOSIS — K200 Eosinophilic esophagitis: Secondary | ICD-10-CM

## 2020-11-01 DIAGNOSIS — K209 Esophagitis, unspecified without bleeding: Secondary | ICD-10-CM

## 2020-11-01 DIAGNOSIS — W44F3XA Food entering into or through a natural orifice, initial encounter: Secondary | ICD-10-CM

## 2020-11-01 DIAGNOSIS — R1310 Dysphagia, unspecified: Secondary | ICD-10-CM | POA: Insufficient documentation

## 2020-11-01 DIAGNOSIS — K2100 Gastro-esophageal reflux disease with esophagitis, without bleeding: Secondary | ICD-10-CM | POA: Insufficient documentation

## 2020-11-01 DIAGNOSIS — Z888 Allergy status to other drugs, medicaments and biological substances status: Secondary | ICD-10-CM | POA: Insufficient documentation

## 2020-11-01 DIAGNOSIS — Z880 Allergy status to penicillin: Secondary | ICD-10-CM | POA: Insufficient documentation

## 2020-11-01 DIAGNOSIS — K226 Gastro-esophageal laceration-hemorrhage syndrome: Secondary | ICD-10-CM | POA: Insufficient documentation

## 2020-11-01 DIAGNOSIS — Z7952 Long term (current) use of systemic steroids: Secondary | ICD-10-CM | POA: Insufficient documentation

## 2020-11-01 HISTORY — DX: Essential (primary) hypertension: I10

## 2020-11-01 LAB — CBC WITH DIFFERENTIAL/PLATELET
Abs Immature Granulocytes: 0.03 10*3/uL (ref 0.00–0.07)
Basophils Absolute: 0.1 10*3/uL (ref 0.0–0.1)
Basophils Relative: 1 %
Eosinophils Absolute: 0.1 10*3/uL (ref 0.0–0.5)
Eosinophils Relative: 1 %
HCT: 51.6 % (ref 39.0–52.0)
Hemoglobin: 18.6 g/dL — ABNORMAL HIGH (ref 13.0–17.0)
Immature Granulocytes: 0 %
Lymphocytes Relative: 34 %
Lymphs Abs: 2.9 10*3/uL (ref 0.7–4.0)
MCH: 32 pg (ref 26.0–34.0)
MCHC: 36 g/dL (ref 30.0–36.0)
MCV: 88.8 fL (ref 80.0–100.0)
Monocytes Absolute: 0.7 10*3/uL (ref 0.1–1.0)
Monocytes Relative: 8 %
Neutro Abs: 4.8 10*3/uL (ref 1.7–7.7)
Neutrophils Relative %: 56 %
Platelets: 367 10*3/uL (ref 150–400)
RBC: 5.81 MIL/uL (ref 4.22–5.81)
RDW: 13.2 % (ref 11.5–15.5)
WBC: 8.6 10*3/uL (ref 4.0–10.5)
nRBC: 0 % (ref 0.0–0.2)

## 2020-11-01 LAB — COMPREHENSIVE METABOLIC PANEL
ALT: 40 U/L (ref 0–44)
AST: 30 U/L (ref 15–41)
Albumin: 4.5 g/dL (ref 3.5–5.0)
Alkaline Phosphatase: 48 U/L (ref 38–126)
Anion gap: 12 (ref 5–15)
BUN: 9 mg/dL (ref 6–20)
CO2: 25 mmol/L (ref 22–32)
Calcium: 9.3 mg/dL (ref 8.9–10.3)
Chloride: 102 mmol/L (ref 98–111)
Creatinine, Ser: 0.92 mg/dL (ref 0.61–1.24)
GFR, Estimated: >60 mL/min (ref >60)
Glucose, Bld: 168 mg/dL — ABNORMAL HIGH (ref 70–99)
Potassium: 3.5 mmol/L (ref 3.5–5.1)
Sodium: 139 mmol/L (ref 135–145)
Total Bilirubin: 0.9 mg/dL (ref 0.3–1.2)
Total Protein: 7.3 g/dL (ref 6.5–8.1)

## 2020-11-01 LAB — RESP PANEL BY RT-PCR (FLU A&B, COVID) ARPGX2
Influenza A by PCR: NEGATIVE
Influenza B by PCR: NEGATIVE
SARS Coronavirus 2 by RT PCR: NEGATIVE

## 2020-11-01 LAB — LIPASE, BLOOD: Lipase: 42 U/L (ref 11–51)

## 2020-11-01 MED ORDER — ONDANSETRON HCL 4 MG/2ML IJ SOLN
4.0000 mg | Freq: Once | INTRAMUSCULAR | Status: AC
  Administered 2020-11-01: 4 mg via INTRAVENOUS
  Filled 2020-11-01: qty 2

## 2020-11-01 MED ORDER — GLUCAGON HCL RDNA (DIAGNOSTIC) 1 MG IJ SOLR
1.0000 mg | Freq: Once | INTRAMUSCULAR | Status: AC
  Administered 2020-11-01: 1 mg via INTRAVENOUS
  Filled 2020-11-01: qty 1

## 2020-11-01 NOTE — ED Provider Notes (Signed)
[facility] Emergency Department Provider Note   ____________________________________________   Event Date/Time   First MD Initiated Contact with Patient 11/01/20 2115     (approximate)  I have reviewed the triage vital signs and the nursing notes.   HISTORY  Chief Complaint Allergic Reaction and Emesis    HPI George Padilla is a 50 y.o. male with past medical history of GERD and alcohol abuse who presents to the ED complaining of nausea and vomiting.  Patient reports that he was eating some chicken for dinner when he had sudden onset of nausea and vomiting.  He reports feeling like something is stuck in the middle of his chest which causes him discomfort and makes it difficult for him to swallow food.  He states he has had this off and on for multiple years, has required endoscopy for esophageal dilation in the past.  He typically has to eat standing up to prevent a blockage from occurring.  He reports vomiting up multiple pieces of chicken but still feels like something is stuck there.  He is having a difficult time swallowing his spit at this time.  EMS was concerned that he could be having an allergic reaction to something he ate and he was given IM epinephrine.  He denies any rash, lightheadedness, difficulty breathing, or diarrhea.        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 Authorizing Provider  aspirin EC 81 MG tablet Take 1 tablet (81 mg total) by mouth daily. 06/08/20   Annice Needy, MD  escitalopram (LEXAPRO) 10 MG tablet  01/11/18   [provider]  esomeprazole (NEXIUM) 20 MG capsule Take by mouth.    [provider]  fluticasone (FLONASE) 50 MCG/ACT nasal spray Place into the nose. 03/29/18   [provider]  losartan (COZAAR) 25 MG tablet Take by mouth. 03/07/18   [provider]  montelukast (SINGULAIR) 10 MG tablet Take by mouth. 10/24/18   [provider]    Allergies Amoxicillin, Bee venom, and Prednisone  Family History  Problem Relation Age of Onset   Bladder Cancer Neg Hx    Kidney cancer Neg Hx    Prostate cancer Neg Hx     Social History Social History   Tobacco Use   Smoking status: Former   Smokeless tobacco: Former  Building services engineer Use: Never used  Substance Use Topics   Alcohol use: Yes    Comment: occasionally   Drug use: No  Review of Systems  Constitutional: No fever/chills Eyes: No visual changes. ENT: No sore throat. Cardiovascular: Denies chest pain. Respiratory: Denies shortness of breath. Gastrointestinal: No abdominal pain.  Positive for nausea and vomiting.  Vomiting.  No diarrhea.  No constipation. Genitourinary: Negative for dysuria. Musculoskeletal: Negative for back pain. Skin: Negative for rash. Neurological: Negative for headaches, focal weakness or numbness.  ____________________________________________   PHYSICAL EXAM:  VITAL SIGNS: ED Triage Vitals  Enc Vitals  Group     BP      Pulse      Resp      Temp      Temp src      SpO2      Weight      Height      Head Circumference      Peak Flow      Pain Score      Pain Loc      Pain Edu?      Excl. in GC?     Constitutional: Alert and oriented. Eyes: Conjunctivae are normal. Head: Atraumatic. Nose: No congestion/rhinnorhea. Mouth/Throat: Mucous membranes are moist. Neck: Normal ROM Cardiovascular: Normal rate, regular rhythm. Grossly normal heart sounds. Respiratory: Normal respiratory effort.  No retractions. Lungs CTAB. Gastrointestinal: Soft and nontender. No distention. Genitourinary: deferred Musculoskeletal: No lower extremity tenderness nor edema. Neurologic:  Normal speech and language. No gross focal neurologic deficits are appreciated. Skin:  Skin is warm, dry and intact. No rash noted. Psychiatric: Mood and affect are normal. Speech and behavior are normal.  ____________________________________________   LABS (all labs ordered are listed, but only abnormal results are displayed)  Labs Reviewed  CBC WITH DIFFERENTIAL/PLATELET - Abnormal; Notable for the following components:      Result Value   Hemoglobin 18.6 (*)    All other components within normal limits  COMPREHENSIVE METABOLIC PANEL - Abnormal; Notable for the following components:   Glucose, Bld 168 (*)    All other components within normal limits  RESP PANEL BY RT-PCR (FLU A&B, COVID) ARPGX2  LIPASE, BLOOD   ____________________________________________  EKG  ED ECG REPORT I, Chesley Noon, the attending physician, personally viewed and interpreted this ECG.   Date: 11/01/2020  EKG Time: 21:20  Rate: 99  Rhythm: normal sinus rhythm  Axis: Normal  Intervals: Prolonged QT  ST&T Change: Normal   PROCEDURES  Procedure(s) performed (including Critical Care):  Procedures   ____________________________________________   INITIAL IMPRESSION / ASSESSMENT AND PLAN / ED COURSE      50 year old  male with past medical history of GERD and alcohol abuse who presents to the ED with acute onset vomiting and feeling like something is stuck in his throat.  He has been unable to keep anything down since the episode, now having difficulty swallowing his oral secretions.  Although there was concern for allergic reaction, symptoms seem most consistent with impacted esophageal food bolus.  We will give dose of IV glucagon and screen labs.  Labs are unremarkable, patient seem to briefly feel better and like the obstruction may have passed after administration of glucagon.  He attempted to drink a small glass of water, but then felt like it got stuck and he vomited back up.  He continues to spit his oral secretions.  Case discussed with Dr. Servando Snare of GI, who would like to observe the patient until 5 AM.  If food bolus remains impacted at that time, he will take patient for endoscopy.  Patient turned over to oncoming provider pending  further observation and potential endoscopy.      ____________________________________________   FINAL CLINICAL IMPRESSION(S) / ED DIAGNOSES  Final diagnoses:  Esophageal obstruction due to food impaction  Eosinophilic esophagitis     ED Discharge Orders     None        Note:  This document was prepared using Dragon voice recognition software and may include unintentional dictation errors.    Chesley Noon, MD 11/01/20 [phone]

## 2020-11-01 NOTE — ED Triage Notes (Signed)
Pt to ED via [facility] from home with c/o allergic reaction after eating salsa with onion in it. 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 Per EMS pt given 0.3mg  epi IM by FD who reported on scene pt with RA sat in the 90's and patient lethargic, on arrival pt A&O x4. Pt given 20mg  Zantac en route and 4mg  Zofran IV en route.

## 2020-11-01 NOTE — ED Triage Notes (Signed)
Pt states "I don't know what happened, I was just eating dinner and I got choked up and threw up". Pt states has happened before. Pt states he is not happy about being here because "[place] almost cost me my arm". Pt c/o abd pain 8/10, intermittent dry heaving and gagging noted during triage.

## 2020-11-02 ENCOUNTER — Emergency Department: Payer: Self-pay

## 2020-11-02 ENCOUNTER — Emergency Department: Payer: Self-pay | Admitting: Anesthesiology

## 2020-11-02 ENCOUNTER — Encounter
Admission: EM | Disposition: A | Discharge status: Home / Self Care | Payer: Self-pay | Source: Home / Self Care | Attending: Emergency Medicine

## 2020-11-02 ENCOUNTER — Encounter: Payer: Self-pay | Admitting: *Deleted

## 2020-11-02 DIAGNOSIS — T18128A Food in esophagus causing other injury, initial encounter: Secondary | ICD-10-CM

## 2020-11-02 DIAGNOSIS — R1319 Other dysphagia: Secondary | ICD-10-CM

## 2020-11-02 DIAGNOSIS — W44F3XA Food entering into or through a natural orifice, initial encounter: Secondary | ICD-10-CM

## 2020-11-02 DIAGNOSIS — K222 Esophageal obstruction: Secondary | ICD-10-CM

## 2020-11-02 DIAGNOSIS — K209 Esophagitis, unspecified without bleeding: Secondary | ICD-10-CM

## 2020-11-02 HISTORY — PX: ESOPHAGOGASTRODUODENOSCOPY: SHX5428

## 2020-11-02 SURGERY — EGD (ESOPHAGOGASTRODUODENOSCOPY)
Anesthesia: General

## 2020-11-02 MED ORDER — FENTANYL CITRATE (PF) 100 MCG/2ML IJ SOLN
INTRAMUSCULAR | Status: AC
  Filled 2020-11-02: qty 2

## 2020-11-02 MED ORDER — LIDOCAINE HCL (PF) 2 % IJ SOLN
INTRAMUSCULAR | Status: AC
  Filled 2020-11-02: qty 5

## 2020-11-02 MED ORDER — ALBUTEROL SULFATE (2.5 MG/3ML) 0.083% IN NEBU
2.5000 mg | INHALATION_SOLUTION | Freq: Four times a day (QID) | RESPIRATORY_TRACT | Status: DC | PRN
Start: 2020-11-02 — End: 2020-11-02
  Administered 2020-11-02: 2.5 mg via RESPIRATORY_TRACT
  Filled 2020-11-02 (×2): qty 3

## 2020-11-02 MED ORDER — LIDOCAINE HCL (CARDIAC) PF 100 MG/5ML IV SOSY
PREFILLED_SYRINGE | INTRAVENOUS | Status: DC | PRN
  Administered 2020-11-02 (×2): 100 mg via INTRAVENOUS

## 2020-11-02 MED ORDER — MORPHINE SULFATE (PF) 4 MG/ML IV SOLN
4.0000 mg | Freq: Once | INTRAVENOUS | Status: AC
  Administered 2020-11-02: 4 mg via INTRAVENOUS
  Filled 2020-11-02: qty 1

## 2020-11-02 MED ORDER — LACTATED RINGERS IV SOLN: INTRAVENOUS | Status: DC | PRN

## 2020-11-02 MED ORDER — PHENYLEPHRINE HCL (PRESSORS) 10 MG/ML IV SOLN
INTRAVENOUS | Status: AC
  Filled 2020-11-02: qty 1

## 2020-11-02 MED ORDER — ONDANSETRON HCL 4 MG/2ML IJ SOLN
INTRAMUSCULAR | Status: DC | PRN
  Administered 2020-11-02: 4 mg via INTRAVENOUS

## 2020-11-02 MED ORDER — FENTANYL CITRATE (PF) 100 MCG/2ML IJ SOLN
INTRAMUSCULAR | Status: DC | PRN
  Administered 2020-11-02: 100 ug via INTRAVENOUS

## 2020-11-02 MED ORDER — MIDAZOLAM HCL 2 MG/2ML IJ SOLN
INTRAMUSCULAR | Status: AC
  Filled 2020-11-02: qty 2

## 2020-11-02 MED ORDER — FENTANYL CITRATE (PF) 100 MCG/2ML IJ SOLN: 25.0000 ug | INTRAMUSCULAR | Status: DC | PRN

## 2020-11-02 MED ORDER — DEXAMETHASONE SODIUM PHOSPHATE 10 MG/ML IJ SOLN
INTRAMUSCULAR | Status: DC | PRN
  Administered 2020-11-02: 10 mg via INTRAVENOUS

## 2020-11-02 MED ORDER — ONDANSETRON HCL 4 MG/2ML IJ SOLN: 4.0000 mg | Freq: Once | INTRAMUSCULAR | Status: DC | PRN

## 2020-11-02 MED ORDER — PROPOFOL 10 MG/ML IV BOLUS
INTRAVENOUS | Status: AC
  Filled 2020-11-02: qty 20

## 2020-11-02 MED ORDER — ONDANSETRON HCL 4 MG/2ML IJ SOLN
INTRAMUSCULAR | Status: AC
  Filled 2020-11-02: qty 2

## 2020-11-02 MED ORDER — MIDAZOLAM HCL 2 MG/2ML IJ SOLN
INTRAMUSCULAR | Status: DC | PRN
  Administered 2020-11-02: 2 mg via INTRAVENOUS

## 2020-11-02 MED ORDER — DEXAMETHASONE SODIUM PHOSPHATE 10 MG/ML IJ SOLN
INTRAMUSCULAR | Status: AC
  Filled 2020-11-02: qty 1

## 2020-11-02 MED ORDER — SUCCINYLCHOLINE CHLORIDE 200 MG/10ML IV SOSY
PREFILLED_SYRINGE | INTRAVENOUS | Status: DC | PRN
  Administered 2020-11-02: 120 mg via INTRAVENOUS

## 2020-11-02 MED ORDER — PROPOFOL 10 MG/ML IV BOLUS
INTRAVENOUS | Status: DC | PRN
  Administered 2020-11-02: 20 mg via INTRAVENOUS
  Administered 2020-11-02: 200 mg via INTRAVENOUS

## 2020-11-02 MED ORDER — ALBUTEROL SULFATE (2.5 MG/3ML) 0.083% IN NEBU
INHALATION_SOLUTION | RESPIRATORY_TRACT | Status: AC
  Filled 2020-11-02: qty 3

## 2020-11-02 MED ORDER — PROPOFOL 500 MG/50ML IV EMUL
INTRAVENOUS | Status: AC
  Filled 2020-11-02: qty 50

## 2020-11-02 NOTE — Anesthesia Postprocedure Evaluation (Addendum)
Anesthesia Post Note  Patient: George Padilla  Procedure(s) Performed: ESOPHAGOGASTRODUODENOSCOPY (EGD)  Patient location during evaluation: PACU Anesthesia Type: General Level of consciousness: awake and alert Pain management: pain level controlled Vital Signs Assessment: post-procedure vital signs reviewed and stable Respiratory status: spontaneous breathing, nonlabored ventilation and respiratory function stable Cardiovascular status: blood pressure returned to baseline and stable Postop Assessment: no apparent nausea or vomiting Anesthetic complications: no Comments: Pt recovered with no complaints after initial coughing resided. However, his oxygen saturations varied initially from low 80's to low 90's. His respiratory rate was within normal limits with other vitals normal. He was placed on 2 L [place] and incentive spirometry was used for pulm hygiene. A chest x-ray was performed with no acute process noted. His oxygen saturation improved to above 88% on room air. Pt denies CP, SOB, Leg swelling, palpitations. His 3 lead EKG was normal sinus rhythm. He was A&Ox4. He was discharged with instructions to return to the emergency room if he had changes to his health such as chest pain, shortness of breath, or lightheadedness. He understood.     No notable events documented.   Last Vitals:  Vitals:   11/02/20 0915 11/02/20 0930  BP: 125/84 126/77  Pulse: 79 86  Resp: 19 18  Temp: 36.7 C   SpO2: (!) 89% 90%    Last Pain:  Vitals:   11/02/20 0915  TempSrc:   PainSc: Asleep                 Foye Deer

## 2020-11-02 NOTE — Anesthesia Preprocedure Evaluation (Addendum)
Anesthesia Evaluation  Patient identified by MRN, date of birth, ID band Patient awake    Airway Mallampati: II  TM Distance: >3 FB Neck ROM: Full    Dental  (+) Teeth Intact   Pulmonary former smoker,           Cardiovascular hypertension,      Neuro/Psych PSYCHIATRIC DISORDERS Ulnar artery aneurysm   Neuromuscular disease    GI/Hepatic Neg liver ROS, GERD  ,Food bolus Hx of esophageal dilatations   Endo/Other  negative endocrine ROS  Renal/GU negative Renal ROS     Musculoskeletal   Abdominal   Peds  Hematology   Anesthesia Other Findings Risk of intubation discussed.  Reproductive/Obstetrics                           Anesthesia Physical Anesthesia Plan  ASA: 3 and emergent  Anesthesia Plan: General   Post-op Pain Management:    Induction: Intravenous  PONV Risk Score and Plan:   Airway Management Planned: Oral ETT  Additional Equipment:   Intra-op Plan:   Post-operative Plan:   Informed Consent: I have reviewed the patients History and Physical, chart, labs and discussed the procedure including the risks, benefits and alternatives for the proposed anesthesia with the patient or authorized representative who has indicated his/her understanding and acceptance.     Dental advisory given  Plan Discussed with: CRNA  Anesthesia Plan Comments: (RSI)      Anesthesia Quick Evaluation

## 2020-11-02 NOTE — Transfer of Care (Signed)
Immediate Anesthesia Transfer of Care Note  Patient: George Padilla  Procedure(s) Performed: ESOPHAGOGASTRODUODENOSCOPY (EGD)  Patient Location: PACU  Anesthesia Type:General  Level of Consciousness: awake, alert  and oriented  Airway & Oxygen Therapy: Patient Spontanous Breathing and Patient connected to face mask oxygen  Post-op Assessment: Report given to RN, Post -op Vital signs reviewed and stable and Patient moving all extremities  Post vital signs: Reviewed and stable  Last Vitals:  Vitals Value Taken Time  BP 138/102 11/02/20 0710  Temp 36.7 C 11/02/20 0710  Pulse 115 11/02/20 0718  Resp 23 11/02/20 0718  SpO2 93 % 11/02/20 0718  Vitals shown include unvalidated device data.  Last Pain:  Vitals:   11/02/20 0710  TempSrc:   PainSc: 0-No pain         Complications: No notable events documented.

## 2020-11-02 NOTE — Anesthesia Procedure Notes (Signed)
Procedure Name: Intubation Date/Time: 11/02/2020 6:45 AM Performed by: Katherine Basset, CRNA Pre-anesthesia Checklist: Patient identified, Emergency Drugs available, Suction available and Patient being monitored Patient Re-evaluated:Patient Re-evaluated prior to induction Oxygen Delivery Method: Circle system utilized Preoxygenation: Pre-oxygenation with 100% oxygen Induction Type: IV induction and Rapid sequence Ventilation: Mask ventilation without difficulty Laryngoscope Size: McGraph and 3 Grade View: Grade I Tube type: Oral Tube size: 7.5 mm Number of attempts: 1 Airway Equipment and Method: Stylet, Oral airway and Bite block Placement Confirmation: ETT inserted through vocal cords under direct vision, positive ETCO2 and breath sounds checked- equal and bilateral Secured at: 23 cm Tube secured with: Tape Dental Injury: Teeth and Oropharynx as per pre-operative assessment

## 2020-11-02 NOTE — H&P (Signed)
George Minium, MD [facility] [address], [address] [place], [place] [address] Phone:[phone] Fax : [phone]  Primary Care Physician:  Jerl Mina, MD Primary Gastroenterologist:  Dr. Servando Snare  Pre-Procedure History & Physical: HPI:  George Padilla is a 50 y.o. male is here for an endoscopy.   Past Medical History:  Diagnosis Date   GERD (gastroesophageal reflux disease)    Seasonal allergies     Past Surgical History:  Procedure Laterality Date   FOOT SURGERY     HERNIA REPAIR     UPPER EXTREMITY ANGIOGRAPHY Right 06/08/2020   Procedure: UPPER EXTREMITY ANGIOGRAPHY;  Surgeon: Annice Needy, MD;  Location: [facility] INVASIVE CV LAB;  Service: Cardiovascular;  Laterality: Right;    Prior to Admission medications   Medication Sig Start Date End Date Taking? Authorizing Provider  aspirin EC 81 MG tablet Take 1 tablet (81 mg total) by mouth daily. 06/08/20   Annice Needy, MD  escitalopram (LEXAPRO) 10 MG tablet  01/11/18   [provider]  esomeprazole (NEXIUM) 20 MG capsule Take by mouth.    [provider]  fluticasone (FLONASE) 50 MCG/ACT nasal spray Place into the nose. 03/29/18   [provider]  losartan (COZAAR) 25 MG tablet Take by mouth. 03/07/18   [provider]  montelukast (SINGULAIR) 10 MG tablet Take by mouth. 10/24/18   [provider]    Allergies as of 11/01/2020 - Review Complete 11/01/2020  Allergen Reaction Noted   Amoxicillin Itching 11/22/2016   Bee venom  07/11/2015   Prednisone Swelling 05/09/2018    Family History  Problem Relation Age of Onset   Bladder Cancer Neg Hx    Kidney cancer Neg Hx    Prostate cancer Neg Hx     Social History   Socioeconomic History   Marital status: Married    Spouse name: Not on file   Number of children: Not on file   Years of education: Not on file   Highest education level: Not on file  Occupational History   Not on file  Tobacco Use   Smoking status: Former    Smokeless tobacco: Former  Building services engineer Use: Never used  Substance and Sexual Activity   Alcohol use: Yes    Comment: occasionally   Drug use: No   Sexual activity: Yes    Birth control/protection: None  Other Topics Concern   Not on file  Social History Narrative   Not on file   Social Determinants of Health   Financial Resource Strain: Not on file  Food Insecurity: Not on file  Transportation Needs: Not on file  Physical Activity: Not on file  Stress: Not on file  Social Connections: Not on file  Intimate Partner Violence: Not on file    Review of Systems: See HPI, otherwise negative ROS  Physical Exam: BP (!) 122/94   Pulse 80   Temp 98.3 F (36.8 C) (Oral)   Resp 19   Ht 5\' 9"  (1.753 m)   Wt 86.2 kg   SpO2 90%   BMI 28.06 kg/m  General:   Alert,  pleasant and cooperative in NAD Head:  Normocephalic and atraumatic. Neck:  Supple; no masses or thyromegaly. Lungs:  Clear throughout to auscultation.    Heart:  Regular rate and rhythm. Abdomen:  Soft, nontender and nondistended. Normal bowel sounds, without guarding, and without rebound.   Neurologic:  Alert and  oriented x4;  grossly normal neurologically.  Impression/Plan:  [name] is here for an endoscopy to be performed for food impaction  Risks, benefits, limitations, and alternatives regarding  endoscopy have been reviewed with the patient.  Questions have been answered.  All parties agreeable.   George Minium, MD  11/02/2020, 6:25 AM

## 2020-11-02 NOTE — H&P (View-Only) (Signed)
 Julien Berryman, MD [facility] [address] [place], [place] [address] Phone:[phone] Fax : [phone]  Primary Care Physician:  Hedrick, James, MD Primary Gastroenterologist:  Dr. Shanyla Marconi  Pre-Procedure History & Physical: HPI:  George Padilla is a 50 y.o. male is here for an endoscopy.   Past Medical History:  Diagnosis Date   GERD (gastroesophageal reflux disease)    Seasonal allergies     Past Surgical History:  Procedure Laterality Date   FOOT SURGERY     HERNIA REPAIR     UPPER EXTREMITY ANGIOGRAPHY Right 06/08/2020   Procedure: UPPER EXTREMITY ANGIOGRAPHY;  Surgeon: Dew, Jason S, MD;  Location: [facility] INVASIVE CV LAB;  Service: Cardiovascular;  Laterality: Right;    Prior to Admission medications   Medication Sig Start Date End Date Taking? Authorizing Provider  aspirin EC 81 MG tablet Take 1 tablet (81 mg total) by mouth daily. 06/08/20   Dew, Jason S, MD  escitalopram (LEXAPRO) 10 MG tablet  01/11/18   [provider]  esomeprazole (NEXIUM) 20 MG capsule Take by mouth.    [provider]  fluticasone (FLONASE) 50 MCG/ACT nasal spray Place into the nose. 03/29/18   [provider]  losartan (COZAAR) 25 MG tablet Take by mouth. 03/07/18   [provider]  montelukast (SINGULAIR) 10 MG tablet Take by mouth. 10/24/18   [provider]    Allergies as of 11/01/2020 - Review Complete 11/01/2020  Allergen Reaction Noted   Amoxicillin Itching 11/22/2016   Bee venom  07/11/2015   Prednisone Swelling 05/09/2018    Family History  Problem Relation Age of Onset   Bladder Cancer Neg Hx    Kidney cancer Neg Hx    Prostate cancer Neg Hx     Social History   Socioeconomic History   Marital status: Married    Spouse name: Not on file   Number of children: Not on file   Years of education: Not on file   Highest education level: Not on file  Occupational History   Not on file  Tobacco Use   Smoking status: Former    Smokeless tobacco: Former  Vaping Use   Vaping Use: Never used  Substance and Sexual Activity   Alcohol use: Yes    Comment: occasionally   Drug use: No   Sexual activity: Yes    Birth control/protection: None  Other Topics Concern   Not on file  Social History Narrative   Not on file   Social Determinants of Health   Financial Resource Strain: Not on file  Food Insecurity: Not on file  Transportation Needs: Not on file  Physical Activity: Not on file  Stress: Not on file  Social Connections: Not on file  Intimate Partner Violence: Not on file    Review of Systems: See HPI, otherwise negative ROS  Physical Exam: BP (!) 122/94   Pulse 80   Temp 98.3 F (36.8 C) (Oral)   Resp 19   Ht 5' 9" (1.753 m)   Wt 86.2 kg   SpO2 90%   BMI 28.06 kg/m  General:   Alert,  pleasant and cooperative in NAD Head:  Normocephalic and atraumatic. Neck:  Supple; no masses or thyromegaly. Lungs:  Clear throughout to auscultation.    Heart:  Regular rate and rhythm. Abdomen:  Soft, nontender and nondistended. Normal bowel sounds, without guarding, and without rebound.   Neurologic:  Alert and  oriented x4;  grossly normal neurologically.  Impression/Plan: George J   Padilla is here for an endoscopy to be performed for food impaction  Risks, benefits, limitations, and alternatives regarding  endoscopy have been reviewed with the patient.  Questions have been answered.  All parties agreeable.   Midge Minium, MD  11/02/2020, 6:25 AM

## 2020-11-02 NOTE — Op Note (Addendum)
[facility] Gastroenterology Patient Name: George Padilla Procedure Date: 11/02/2020 6:39 AM MRN: [id number] Account #: [id number] Date of Birth: 1970-12-03 Admit Type: Outpatient Age: 50 Room: [facility] ENDO ROOM 4 Gender: Male Note Status: Finalized Procedure:             Upper GI endoscopy Indications:           Dysphagia, Foreign body in the esophagus Providers:             Midge Minium MD, MD Referring MD:          Rhona Leavens. Burnett Sheng, MD (Referring MD) Medicines:             Propofol per Anesthesia Complications:         No immediate complications. Procedure:             Pre-Anesthesia Assessment:                        - Prior to the procedure, a History and Physical was                         performed, and patient medications and allergies were                         reviewed. The patient's tolerance of previous                         anesthesia was also reviewed. The risks and benefits                         of the procedure and the sedation options and risks                         were discussed with the patient. All questions were                         answered, and informed consent was obtained. Prior                         Anticoagulants: The patient has taken no previous                         anticoagulant or antiplatelet agents. ASA Grade                         Assessment: II - A patient with mild systemic disease.                         After reviewing the risks and benefits, the patient                         was deemed in satisfactory condition to undergo the                         procedure.                        After obtaining informed consent, the endoscope was  passed under direct vision. Throughout the procedure,                         the patient's blood pressure, pulse, and oxygen                         saturations were monitored continuously. The Endoscope                         was introduced through the  mouth, and advanced to the                         second part of duodenum. The upper GI endoscopy was                         accomplished without difficulty. The patient tolerated                         the procedure well. Findings:      One benign-appearing, intrinsic moderate stenosis was found at the       gastroesophageal junction. The stenosis was traversed.      LA Grade D (one or more mucosal breaks involving at least 75% of       esophageal circumference) esophagitis with no bleeding was found in the       lower third of the esophagus.      A non-bleeding Mallory-Weiss tear with no stigmata of recent bleeding       was found.      The stomach was normal.      The examined duodenum was normal. Impression:            - Benign-appearing esophageal stenosis without food in                         the esophagus or stomach.                        - LA Grade D esophagitis with no bleeding.                        - Mallory-Weiss tear.                        - Normal stomach.                        - Normal examined duodenum.                        - No specimens collected. Recommendation:        - Discharge patient to home.                        - Soft diet.                        - Continue present medications.                        - Await pathology results.                        -  Repeat upper endoscopy in 3 weeks for retreatment. Procedure Code(s):     --- Professional ---                        [phone], Esophagogastroduodenoscopy, flexible,                         transoral; diagnostic, including collection of                         specimen(s) by brushing or washing, when performed                         (separate procedure) Diagnosis Code(s):     --- Professional ---                        R13.10, Dysphagia, unspecified                        T18.108A, Unspecified foreign body in esophagus                         causing other injury, initial encounter                         K22.6, Gastro-esophageal laceration-hemorrhage syndrome                        K20.90, Esophagitis, unspecified without bleeding CPT copyright 2019 American Medical Association. All rights reserved. The codes documented in this report are preliminary and upon coder review may  be revised to meet current compliance requirements. Midge Minium MD, MD 11/02/2020 6:56:14 AM This report has been signed electronically. Number of Addenda: 0 Note Initiated On: 11/02/2020 6:39 AM Estimated Blood Loss:  Estimated blood loss: none. Estimated blood loss: none.      [facility]

## 2020-11-03 ENCOUNTER — Other Ambulatory Visit: Payer: Self-pay

## 2020-11-03 ENCOUNTER — Telehealth: Payer: Self-pay | Admitting: Gastroenterology

## 2020-11-03 ENCOUNTER — Encounter: Payer: Self-pay | Admitting: Gastroenterology

## 2020-11-03 DIAGNOSIS — R1319 Other dysphagia: Secondary | ICD-10-CM

## 2020-11-03 NOTE — Telephone Encounter (Signed)
Pt has been scheduled for EGD at [facility] on 11/30/20.

## 2020-11-03 NOTE — Telephone Encounter (Signed)
Pt. Is an ED pt. Needs follow up EGD in 3 weeks. Please call patient

## 2020-11-06 ENCOUNTER — Other Ambulatory Visit: Payer: Self-pay | Admitting: *Deleted

## 2020-11-06 DIAGNOSIS — N138 Other obstructive and reflux uropathy: Secondary | ICD-10-CM

## 2020-11-06 DIAGNOSIS — N401 Enlarged prostate with lower urinary tract symptoms: Secondary | ICD-10-CM

## 2020-11-06 DIAGNOSIS — N529 Male erectile dysfunction, unspecified: Secondary | ICD-10-CM

## 2020-11-06 NOTE — Progress Notes (Deleted)
11/09/2020 5:11 PM   George Padilla January 08, 1971 [id number]  Referring provider: Jerl Mina, MD [address] [place],  [place] [address]  Urological history: 1. BPH with LU TS -PSA pending -I PSS***  2. Epididymal cysts -seen on 2019 scrotal ultrasound  3. Left varicocele -seen on 2019 scrotal ultrasound  4. ED -contributing factors of age, BPH and substance/alcohol abuse -SHIM *** -  No chief complaint on file.   HPI: George Padilla is a 50 y.o. who presents today for follow up.       Score:  1-7 Mild 8-19 Moderate 20-35 Severe      Score: 1-7 Severe ED 8-11 Moderate ED 12-16 Mild-Moderate ED 17-21 Mild ED 22-25 No ED  PMH: Past Medical History:  Diagnosis Date   GERD (gastroesophageal reflux disease)    Hypertension    Seasonal allergies     Surgical History: Past Surgical History:  Procedure Laterality Date   ESOPHAGOGASTRODUODENOSCOPY N/A 11/02/2020   Procedure: ESOPHAGOGASTRODUODENOSCOPY (EGD);  Surgeon: Midge Minium, MD;  Location: [facility] ENDOSCOPY;  Service: Endoscopy;  Laterality: N/A;   FOOT SURGERY     HERNIA REPAIR     UPPER EXTREMITY ANGIOGRAPHY Right 06/08/2020   Procedure: UPPER EXTREMITY ANGIOGRAPHY;  Surgeon: Annice Needy, MD;  Location: [facility] INVASIVE CV LAB;  Service: Cardiovascular;  Laterality: Right;    Home Medications:  Allergies as of 11/09/2020       Reactions   Amoxicillin Itching   Bee Venom    Prednisone Swelling        Medication List        Accurate as of November 06, 2020  5:11 PM. If you have any questions, ask your nurse or doctor.          aspirin EC 81 MG tablet Take 1 tablet (81 mg total) by mouth daily.   escitalopram 10 MG tablet Commonly known as: LEXAPRO   esomeprazole 20 MG capsule Commonly known as: NEXIUM Take by mouth.   fluticasone 50 MCG/ACT nasal spray Commonly known as: FLONASE Place into the nose.   losartan 25 MG tablet Commonly known as:  COZAAR Take by mouth.   montelukast 10 MG tablet Commonly known as: SINGULAIR Take by mouth.        Allergies:  Allergies  Allergen Reactions   Amoxicillin Itching   Bee Venom    Prednisone Swelling    Family History: Family History  Problem Relation Age of Onset   Bladder Cancer Neg Hx    Kidney cancer Neg Hx    Prostate cancer Neg Hx     Social History:  reports that he has quit smoking. He has quit using smokeless tobacco. He reports current alcohol use. He reports that he does not use drugs.  ROS: For pertinent review of systems please refer to history of present illness  Physical Exam: There were no vitals taken for this visit.  Constitutional:  Well nourished. Alert and oriented, No acute distress. HEENT: [place] AT, moist mucus membranes.  Trachea midline Cardiovascular: No clubbing, cyanosis, or edema. Respiratory: Normal respiratory effort, no increased work of breathing. GI: Abdomen is soft, non tender, non distended, no abdominal masses. Liver and spleen not palpable.  No hernias appreciated.  Stool sample for occult testing is not indicated.   GU: No CVA tenderness.  No bladder fullness or masses.  Patient with circumcised/uncircumcised phallus. ***Foreskin easily retracted***  Urethral meatus is patent.  No penile discharge. No penile lesions or  rashes. Scrotum without lesions, cysts, rashes and/or edema.  Testicles are located scrotally bilaterally. No masses are appreciated in the testicles. Left and right epididymis are normal. Rectal: Patient with  normal sphincter tone. Anus and perineum without scarring or rashes. No rectal masses are appreciated. Prostate is approximately *** grams, *** nodules are appreciated. Seminal vesicles are normal. Skin: No rashes, bruises or suspicious lesions. Lymph: No inguinal adenopathy. Neurologic: Grossly intact, no focal deficits, moving all 4 extremities. Psychiatric: Normal mood and affect.   Laboratory Data: Lab Results   Component Value Date   WBC 8.6 11/01/2020   HGB 18.6 (H) 11/01/2020   HCT 51.6 11/01/2020   MCV 88.8 11/01/2020   PLT 367 11/01/2020    Lab Results  Component Value Date   CREATININE 0.92 11/01/2020    Lab Results  Component Value Date   AST 30 11/01/2020   Lab Results  Component Value Date   ALT 40 11/01/2020    Urinalysis ***  I have reviewed the labs.   Assessment & Plan:    1. Erectile dysfunction SHIM score is 17, it is stable  RTC in 12 months for SHIM and exam  2. BPH with LUTS IPSS score is 1/0, it is stable   Continue conservative management, avoiding bladder irritants and timed voiding's He has an appointment for his yearly physical next week and will have his PSA drawn at that time  RTC in 12 months for IPSS, PSA and exam   No follow-ups on file.  These notes generated with voice recognition software. I apologize for typographical errors.  Michiel Cowboy, PA-C  [facility] [address]  [address] [place], [place] [address] [phone]

## 2020-11-09 ENCOUNTER — Ambulatory Visit: Payer: 59 | Admitting: Urology

## 2020-11-09 DIAGNOSIS — N529 Male erectile dysfunction, unspecified: Secondary | ICD-10-CM

## 2020-11-09 DIAGNOSIS — N138 Other obstructive and reflux uropathy: Secondary | ICD-10-CM

## 2020-11-11 ENCOUNTER — Encounter: Payer: Self-pay | Admitting: Gastroenterology

## 2020-11-30 ENCOUNTER — Ambulatory Visit: Payer: Self-pay | Admitting: Anesthesiology

## 2020-11-30 ENCOUNTER — Other Ambulatory Visit: Payer: Self-pay

## 2020-11-30 ENCOUNTER — Encounter
Admission: RE | Disposition: A | Discharge status: Home / Self Care | Payer: Self-pay | Source: Ambulatory Visit | Attending: Gastroenterology

## 2020-11-30 ENCOUNTER — Encounter: Payer: Self-pay | Admitting: Gastroenterology

## 2020-11-30 ENCOUNTER — Ambulatory Visit
Admission: RE | Admit: 2020-11-30 | Discharge: 2020-11-30 | Disposition: A | Discharge status: Home / Self Care | Payer: Self-pay | Source: Ambulatory Visit | Attending: Gastroenterology | Admitting: Gastroenterology

## 2020-11-30 DIAGNOSIS — Z79899 Other long term (current) drug therapy: Secondary | ICD-10-CM | POA: Insufficient documentation

## 2020-11-30 DIAGNOSIS — Z87891 Personal history of nicotine dependence: Secondary | ICD-10-CM | POA: Insufficient documentation

## 2020-11-30 DIAGNOSIS — Z880 Allergy status to penicillin: Secondary | ICD-10-CM | POA: Insufficient documentation

## 2020-11-30 DIAGNOSIS — K219 Gastro-esophageal reflux disease without esophagitis: Secondary | ICD-10-CM | POA: Insufficient documentation

## 2020-11-30 DIAGNOSIS — Z7982 Long term (current) use of aspirin: Secondary | ICD-10-CM | POA: Insufficient documentation

## 2020-11-30 DIAGNOSIS — K222 Esophageal obstruction: Secondary | ICD-10-CM | POA: Insufficient documentation

## 2020-11-30 DIAGNOSIS — R1319 Other dysphagia: Secondary | ICD-10-CM

## 2020-11-30 DIAGNOSIS — R1310 Dysphagia, unspecified: Secondary | ICD-10-CM | POA: Insufficient documentation

## 2020-11-30 DIAGNOSIS — Z888 Allergy status to other drugs, medicaments and biological substances status: Secondary | ICD-10-CM | POA: Insufficient documentation

## 2020-11-30 HISTORY — PX: ESOPHAGOGASTRODUODENOSCOPY (EGD) WITH PROPOFOL: SHX5813

## 2020-11-30 SURGERY — ESOPHAGOGASTRODUODENOSCOPY (EGD) WITH PROPOFOL
Anesthesia: General | Site: Esophagus

## 2020-11-30 MED ORDER — PROPOFOL 10 MG/ML IV BOLUS
INTRAVENOUS | Status: DC | PRN
  Administered 2020-11-30 (×2): 30 mg via INTRAVENOUS
  Administered 2020-11-30: 50 mg via INTRAVENOUS
  Administered 2020-11-30: 150 mg via INTRAVENOUS

## 2020-11-30 MED ORDER — STERILE WATER FOR IRRIGATION IR SOLN
Status: DC | PRN
  Administered 2020-11-30: 50 mL

## 2020-11-30 MED ORDER — SODIUM CHLORIDE 0.9 % IV SOLN: INTRAVENOUS | Status: DC

## 2020-11-30 MED ORDER — LACTATED RINGERS IV SOLN: INTRAVENOUS | Status: DC

## 2020-11-30 MED ORDER — GLYCOPYRROLATE 0.2 MG/ML IJ SOLN
INTRAMUSCULAR | Status: DC | PRN
  Administered 2020-11-30: .1 mg via INTRAVENOUS

## 2020-11-30 MED ORDER — ONDANSETRON HCL 4 MG/2ML IJ SOLN: 4.0000 mg | Freq: Once | INTRAMUSCULAR | Status: DC | PRN

## 2020-11-30 MED ORDER — LIDOCAINE HCL (CARDIAC) PF 100 MG/5ML IV SOSY
PREFILLED_SYRINGE | INTRAVENOUS | Status: DC | PRN
  Administered 2020-11-30: 30 mg via INTRAVENOUS

## 2020-11-30 MED ORDER — ACETAMINOPHEN 10 MG/ML IV SOLN: 1000.0000 mg | Freq: Once | INTRAVENOUS | Status: DC | PRN

## 2020-11-30 SURGICAL SUPPLY — 11 items
BALLN DILATOR 15-18 8 (BALLOONS) ×2
BALLOON DILATOR 15-18 8 (BALLOONS) ×1 IMPLANT
BLOCK BITE 60FR ADLT L/F GRN (MISCELLANEOUS) ×2 IMPLANT
FORCEPS BIOP RAD 4 LRG CAP 4 (CUTTING FORCEPS) ×2 IMPLANT
GOWN CVR UNV OPN BCK APRN NK (MISCELLANEOUS) ×2 IMPLANT
GOWN ISOL THUMB LOOP REG UNIV (MISCELLANEOUS) ×4
KIT PRC NS LF DISP ENDO (KITS) ×1 IMPLANT
KIT PROCEDURE OLYMPUS (KITS) ×2
MANIFOLD NEPTUNE II (INSTRUMENTS) ×2 IMPLANT
SYR INFLATION 60ML (SYRINGE) ×2 IMPLANT
WATER STERILE IRR 250ML POUR (IV SOLUTION) ×2 IMPLANT

## 2020-11-30 NOTE — Anesthesia Preprocedure Evaluation (Signed)
Anesthesia Evaluation  Patient identified by MRN, date of birth, ID band Patient awake    Reviewed: Allergy & Precautions, NPO status , Patient's Chart, lab work & pertinent test results, reviewed documented beta blocker date and time   History of Anesthesia Complications Negative for: history of anesthetic complications  Airway Mallampati: III  TM Distance: >3 FB Neck ROM: Full    Dental   Pulmonary former smoker,    breath sounds clear to auscultation       Cardiovascular hypertension, (-) angina(-) DOE  Rhythm:Regular Rate:Normal     Neuro/Psych PSYCHIATRIC DISORDERS  Neuromuscular disease (Cervical stenosis)    GI/Hepatic GERD  ,(+)     substance abuse  alcohol use,  Dysphagia   Endo/Other    Renal/GU      Musculoskeletal   Abdominal   Peds  Hematology   Anesthesia Other Findings H/o DVT in R hand, was prescribed anticoagulation, but not compliant with it because cannot afford it  Reproductive/Obstetrics                             Anesthesia Physical Anesthesia Plan  ASA: 2  Anesthesia Plan: General   Post-op Pain Management:    Induction: Intravenous  PONV Risk Score and Plan: 2 and Propofol infusion, TIVA and Treatment may vary due to age or medical condition  Airway Management Planned: Natural Airway and Nasal Cannula  Additional Equipment:   Intra-op Plan:   Post-operative Plan:   Informed Consent: I have reviewed the patients History and Physical, chart, labs and discussed the procedure including the risks, benefits and alternatives for the proposed anesthesia with the patient or authorized representative who has indicated his/her understanding and acceptance.       Plan Discussed with: CRNA and Anesthesiologist  Anesthesia Plan Comments:         Anesthesia Quick Evaluation

## 2020-11-30 NOTE — Anesthesia Procedure Notes (Signed)
Date/Time: 11/30/2020 10:10 AM Performed by: Maree Krabbe, CRNA Pre-anesthesia Checklist: Patient identified, Emergency Drugs available, Suction available, Timeout performed and Patient being monitored Patient Re-evaluated:Patient Re-evaluated prior to induction Oxygen Delivery Method: Nasal cannula Placement Confirmation: positive ETCO2

## 2020-11-30 NOTE — Op Note (Signed)
[facility] Gastroenterology Patient Name: George Padilla Procedure Date: 11/30/2020 10:02 AM MRN: [id number] Account #: [id number] Date of Birth: [birth year] Admit Type: Outpatient Age: 50 Room: [facility] OR ROOM 01 Gender: Male Note Status: Finalized Instrument Name: [id number] Procedure:             Upper GI endoscopy Indications:           Dysphagia Providers:             Midge Minium MD, MD Referring MD:          Rhona Leavens. Burnett Sheng, MD (Referring MD) Medicines:             Propofol per Anesthesia Complications:         No immediate complications. Procedure:             Pre-Anesthesia Assessment:                        - Prior to the procedure, a History and Physical was                         performed, and patient medications and allergies were                         reviewed. The patient's tolerance of previous                         anesthesia was also reviewed. The risks and benefits                         of the procedure and the sedation options and risks                         were discussed with the patient. All questions were                         answered, and informed consent was obtained. Prior                         Anticoagulants: The patient has taken no previous                         anticoagulant or antiplatelet agents. ASA Grade                         Assessment: II - A patient with mild systemic disease.                         After reviewing the risks and benefits, the patient                         was deemed in satisfactory condition to undergo the                         procedure.                        After obtaining informed consent, the endoscope was  passed under direct vision. Throughout the procedure,                         the patient's blood pressure, pulse, and oxygen                         saturations were monitored continuously. The Endoscope                         was introduced through the mouth,  and advanced to the                         second part of duodenum. The upper GI endoscopy was                         accomplished without difficulty. The patient tolerated                         the procedure well. Findings:      One benign-appearing, intrinsic mild stenosis was found at the       gastroesophageal junction. The stenosis was traversed. A TTS dilator was       passed through the scope. Dilation with a 15-16.5-18 mm balloon dilator       was performed to 18 mm. The dilation site was examined following       endoscope reinsertion and showed complete resolution of luminal       narrowing. This was biopsied with a cold forceps for histology.      The stomach was normal.      The examined duodenum was normal. Impression:            - Benign-appearing esophageal stenosis. Dilated.                         Biopsied.                        - Normal stomach.                        - Normal examined duodenum. Recommendation:        - Discharge patient to home.                        - Resume previous diet.                        - Continue present medications.                        - Await pathology results. Procedure Code(s):     --- Professional ---                        [phone], Esophagogastroduodenoscopy, flexible,                         transoral; with transendoscopic balloon dilation of                         esophagus (less than 30 mm diameter)  43239, 59, Esophagogastroduodenoscopy, flexible,                         transoral; with biopsy, single or multiple Diagnosis Code(s):     --- Professional ---                        R13.10, Dysphagia, unspecified                        K22.2, Esophageal obstruction CPT copyright 2019 American Medical Association. All rights reserved. The codes documented in this report are preliminary and upon coder review may  be revised to meet current compliance requirements. Midge Minium MD, MD 11/30/2020 10:20:02  AM This report has been signed electronically. Number of Addenda: 0 Note Initiated On: 11/30/2020 10:02 AM Total Procedure Duration: 0 hours 5 minutes 18 seconds  Estimated Blood Loss:  Estimated blood loss: none.      [facility]

## 2020-11-30 NOTE — Transfer of Care (Signed)
Immediate Anesthesia Transfer of Care Note  Patient: George Padilla  Procedure(s) Performed: ESOPHAGOGASTRODUODENOSCOPY (EGD) WITH PROPOFOL  Patient Location: PACU  Anesthesia Type: General  Level of Consciousness: awake, alert  and patient cooperative  Airway and Oxygen Therapy: Patient Spontanous Breathing and Patient connected to supplemental oxygen  Post-op Assessment: Post-op Vital signs reviewed, Patient's Cardiovascular Status Stable, Respiratory Function Stable, Patent Airway and No signs of Nausea or vomiting  Post-op Vital Signs: Reviewed and stable  Complications: No notable events documented.

## 2020-11-30 NOTE — Anesthesia Postprocedure Evaluation (Signed)
Anesthesia Post Note  Patient: George Padilla  Procedure(s) Performed: ESOPHAGOGASTRODUODENOSCOPY (EGD) WITH PROPOFOL (Esophagus)     Patient location during evaluation: PACU Anesthesia Type: General Level of consciousness: awake and alert Pain management: pain level controlled Vital Signs Assessment: post-procedure vital signs reviewed and stable Respiratory status: spontaneous breathing, nonlabored ventilation, respiratory function stable and patient connected to nasal cannula oxygen Cardiovascular status: blood pressure returned to baseline and stable Postop Assessment: no apparent nausea or vomiting Anesthetic complications: no   No notable events documented.  George Padilla  George Padilla

## 2020-11-30 NOTE — Interval H&P Note (Signed)
Midge Minium, MD [facility] [address], [address] [place], [place] [address] Phone:[phone] Fax : [phone]  Primary Care Physician:  Jerl Mina, MD Primary Gastroenterologist:  Dr. Servando Snare  Pre-Procedure History & Physical: HPI:  George Padilla is a 50 y.o. male is here for an endoscopy.   Past Medical History:  Diagnosis Date   GERD (gastroesophageal reflux disease)    Hypertension    Seasonal allergies     Past Surgical History:  Procedure Laterality Date   ESOPHAGOGASTRODUODENOSCOPY N/A 11/02/2020   Procedure: ESOPHAGOGASTRODUODENOSCOPY (EGD);  Surgeon: Midge Minium, MD;  Location: [facility] ENDOSCOPY;  Service: Endoscopy;  Laterality: N/A;   FOOT SURGERY     HERNIA REPAIR     UPPER EXTREMITY ANGIOGRAPHY Right 06/08/2020   Procedure: UPPER EXTREMITY ANGIOGRAPHY;  Surgeon: Annice Needy, MD;  Location: [facility] INVASIVE CV LAB;  Service: Cardiovascular;  Laterality: Right;    Prior to Admission medications   Medication Sig Start Date End Date Taking? Authorizing Provider  escitalopram (LEXAPRO) 10 MG tablet  01/11/18  Yes [provider]  esomeprazole (NEXIUM) 20 MG capsule Take by mouth.   Yes [provider]  fluticasone (FLONASE) 50 MCG/ACT nasal spray Place into the nose. 03/29/18  Yes [provider]  losartan (COZAAR) 25 MG tablet Take by mouth. 03/07/18  Yes [provider]  montelukast (SINGULAIR) 10 MG tablet Take by mouth. 10/24/18  Yes [provider]  aspirin EC 81 MG tablet Take 1 tablet (81 mg total) by mouth daily. Patient not taking: Reported on 11/11/2020 06/08/20   Annice Needy, MD    Allergies as of 11/03/2020 - Review Complete 11/02/2020  Allergen Reaction Noted   Amoxicillin Itching 11/22/2016   Bee venom  07/11/2015   Prednisone Swelling 05/09/2018    Family History  Problem Relation Age of Onset   Bladder Cancer Neg Hx    Kidney cancer Neg Hx    Prostate cancer Neg Hx     Social History   Socioeconomic  History   Marital status: Married    Spouse name: Not on file   Number of children: Not on file   Years of education: Not on file   Highest education level: Not on file  Occupational History   Not on file  Tobacco Use   Smoking status: Former   Smokeless tobacco: Former  Building services engineer Use: Never used  Substance and Sexual Activity   Alcohol use: Yes    Comment: occasionally   Drug use: No   Sexual activity: Yes    Birth control/protection: None  Other Topics Concern   Not on file  Social History Narrative   Not on file   Social Determinants of Health   Financial Resource Strain: Not on file  Food Insecurity: Not on file  Transportation Needs: Not on file  Physical Activity: Not on file  Stress: Not on file  Social Connections: Not on file  Intimate Partner Violence: Not on file    Review of Systems: See HPI, otherwise negative ROS  Physical Exam: BP (!) 154/107   Pulse 65   Temp 98.1 F (36.7 C) (Temporal)   Ht 5\' 8"  (1.727 m)   Wt 84.8 kg   SpO2 96%   BMI 28.43 kg/m  General:   Alert,  pleasant and cooperative in NAD Head:  Normocephalic and atraumatic. Neck:  Supple; no masses or thyromegaly. Lungs:  Clear throughout to auscultation.    Heart:  Regular rate and rhythm.  Abdomen:  Soft, nontender and nondistended. Normal bowel sounds, without guarding, and without rebound.   Neurologic:  Alert and  oriented x4;  grossly normal neurologically.  Impression/Plan: George Padilla is here for an endoscopy to be performed for dysphagia  Risks, benefits, limitations, and alternatives regarding  endoscopy have been reviewed with the patient.  Questions have been answered.  All parties agreeable.   Midge Minium, MD  11/30/2020, 9:28 AM

## 2020-12-01 ENCOUNTER — Encounter: Payer: Self-pay | Admitting: Gastroenterology

## 2020-12-02 LAB — SURGICAL PATHOLOGY

## 2020-12-03 ENCOUNTER — Encounter: Payer: Self-pay | Admitting: Gastroenterology

## 2022-03-21 HISTORY — PX: COLONOSCOPY W/ POLYPECTOMY: SHX1380

## 2022-05-23 IMAGING — US US EXTREM  UP VENOUS*R*
1 series · 13 of 24 positions shown · non-contrast
Comparison: None.

CLINICAL DATA: Right upper extremity pain.



[Series 1: us venous img upper uni right (dvt) · portal-venous · 13 of 31 slices shown]
[im 1/31]
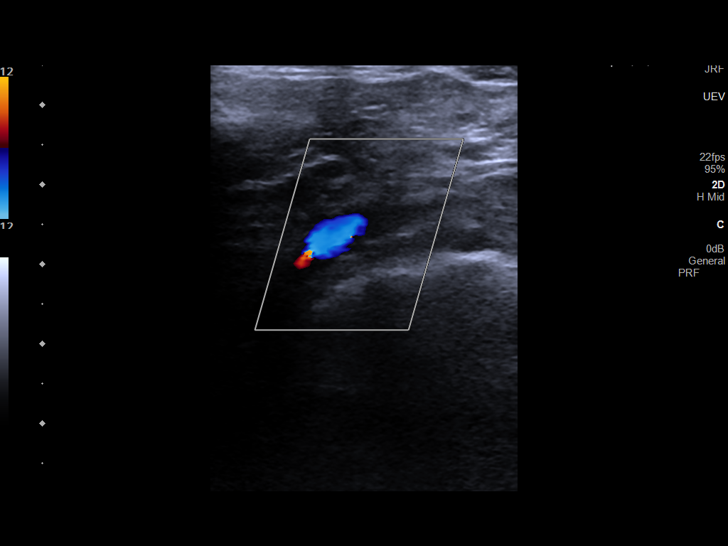
[im 3/31]
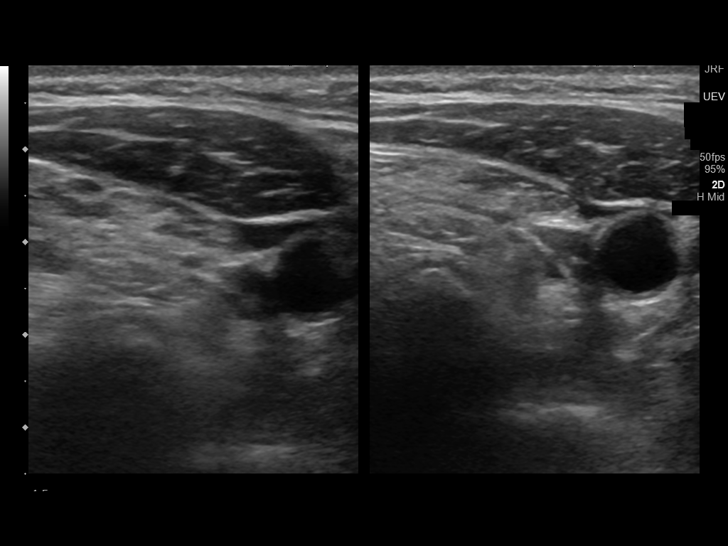
[im 6/31]
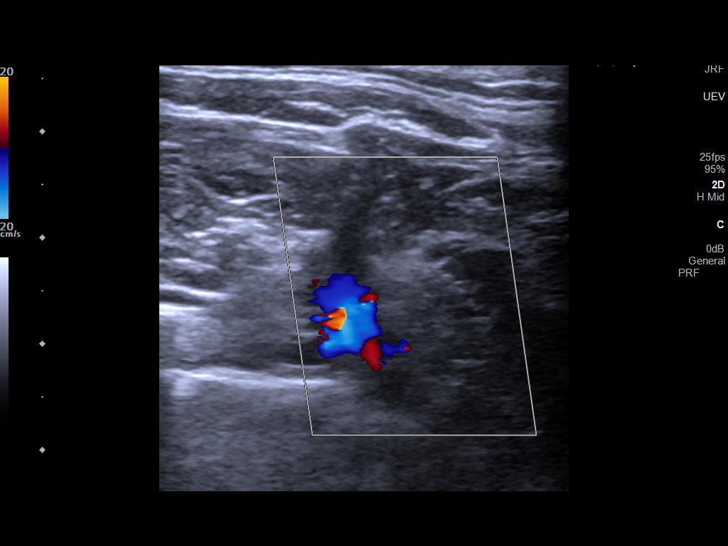
[im 8/31]
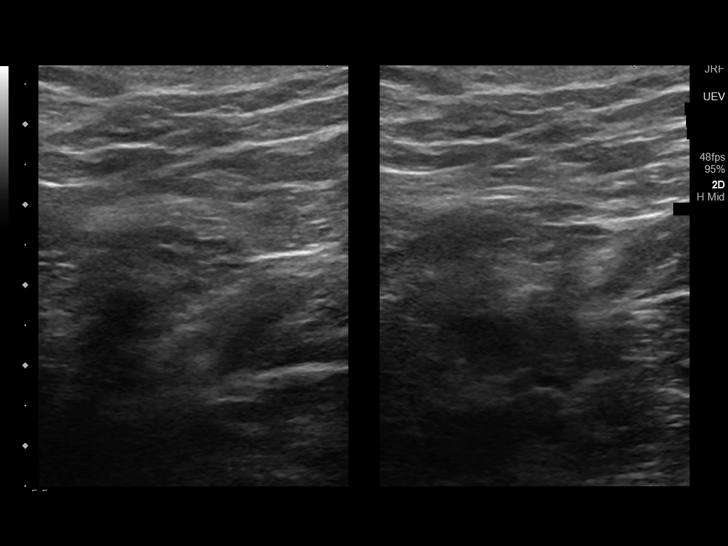
[im 11/31]
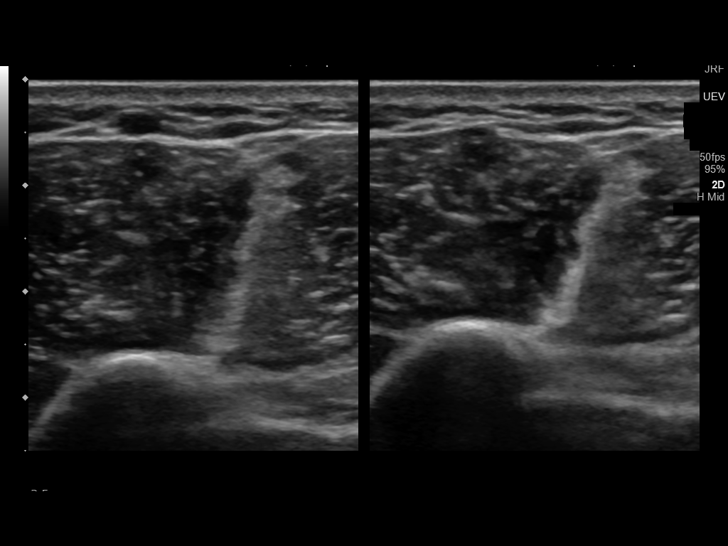
[im 14/31]
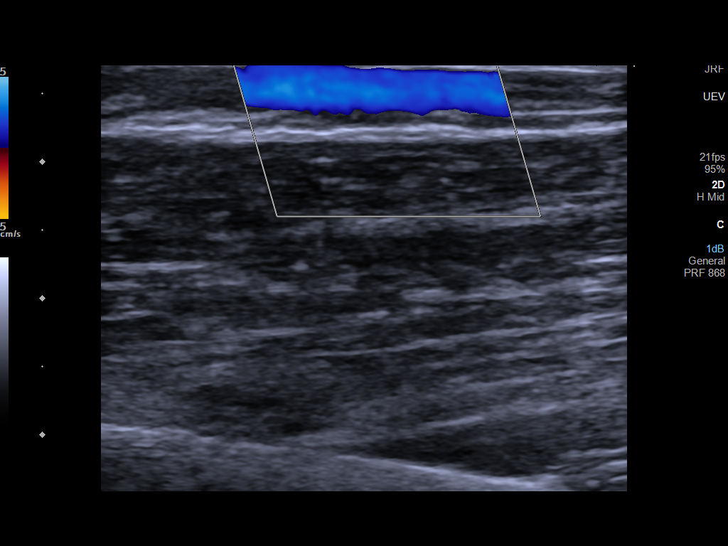
[im 16/31]
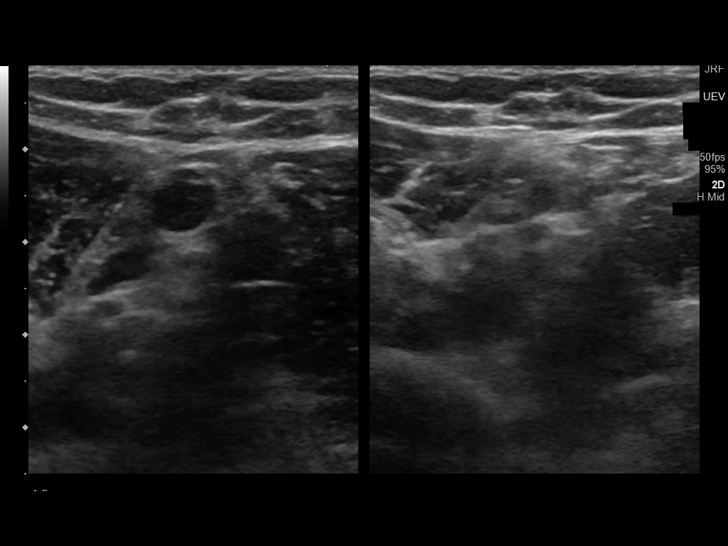
[im 17/31]
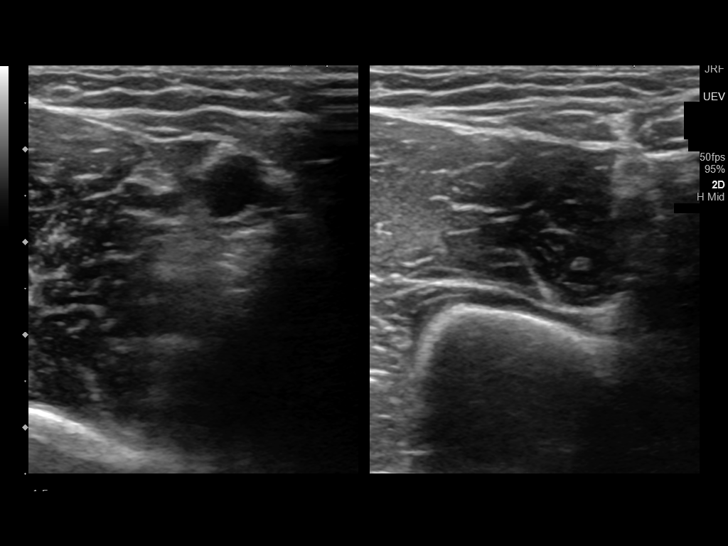
[im 20/31]
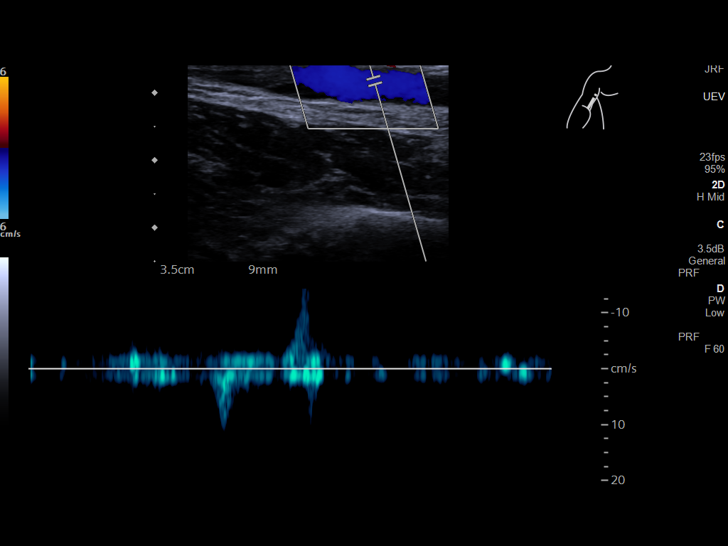
[im 23/31]
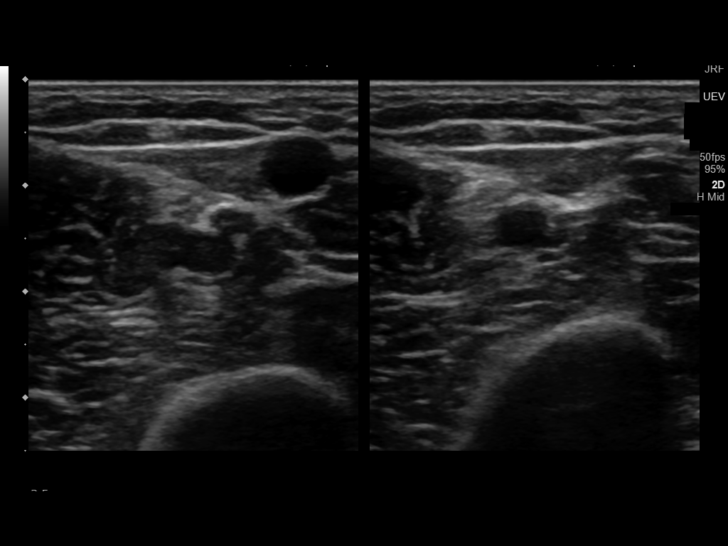
[im 25/31]
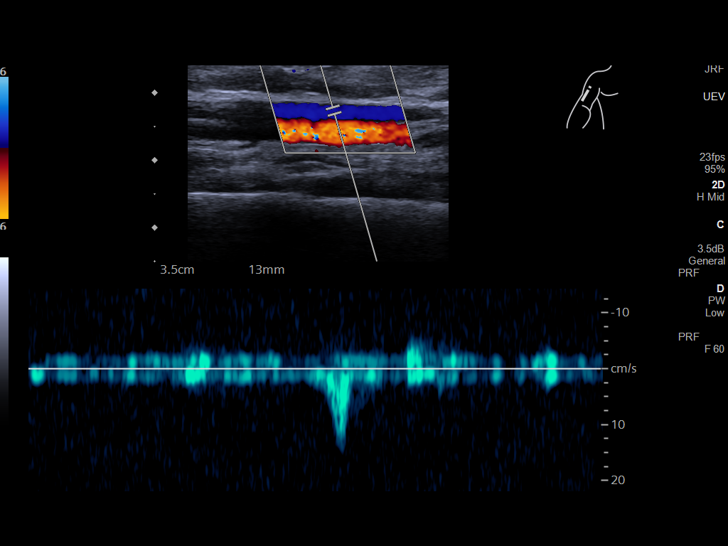
[im 28/31]
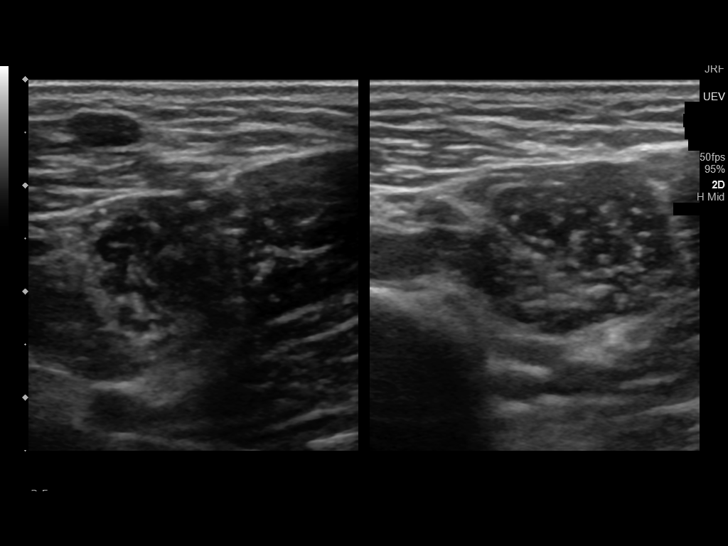
[im 31/31]
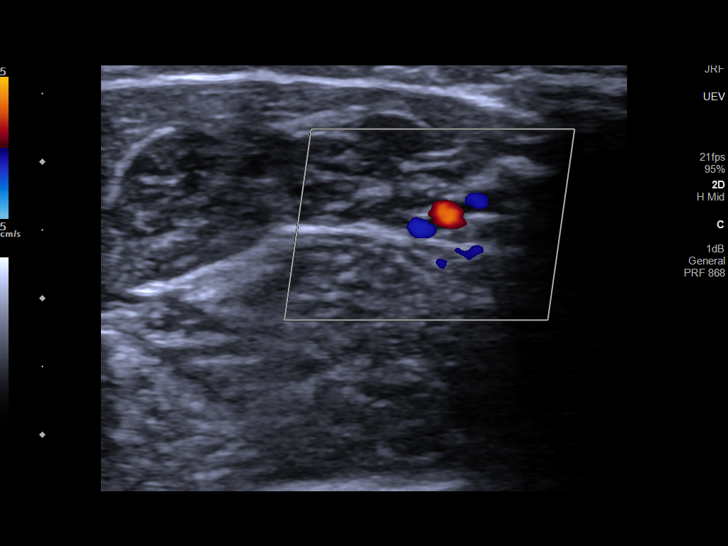

[13 of 24 positions shown; findings below may reference images not displayed]

FINDINGS: Contralateral Subclavian Vein: Respiratory phasicity is normal and
symmetric with the symptomatic side. No evidence of thrombus. Normal
compressibility.

Internal Jugular Vein: No evidence of thrombus. Normal
compressibility, respiratory phasicity and response to augmentation.

Subclavian Vein: No evidence of thrombus. Normal compressibility,
respiratory phasicity and response to augmentation.

Axillary Vein: No evidence of thrombus. Normal compressibility,
respiratory phasicity and response to augmentation.

Cephalic Vein: No evidence of thrombus. Normal compressibility,
respiratory phasicity and response to augmentation.

Basilic Vein: No evidence of thrombus. Normal compressibility,
respiratory phasicity and response to augmentation.

Brachial Veins: No evidence of thrombus. Normal compressibility,
respiratory phasicity and response to augmentation.

Radial Veins: No evidence of thrombus. Normal compressibility,
respiratory phasicity and response to augmentation.

Ulnar Veins: No evidence of thrombus. Normal compressibility,
respiratory phasicity and response to augmentation.

Other Findings:  None visualized.
IMPRESSION: No evidence of DVT within the right upper extremity.

## 2022-10-28 IMAGING — DX DG CHEST 1V PORT
1 series · 1 of 1 positions shown · non-contrast
Comparison: Chest x-ray dated December 29, 2010.

CLINICAL DATA: Hypoxia.

EXAM:
PORTABLE CHEST 1 VIEW

[chest ap]
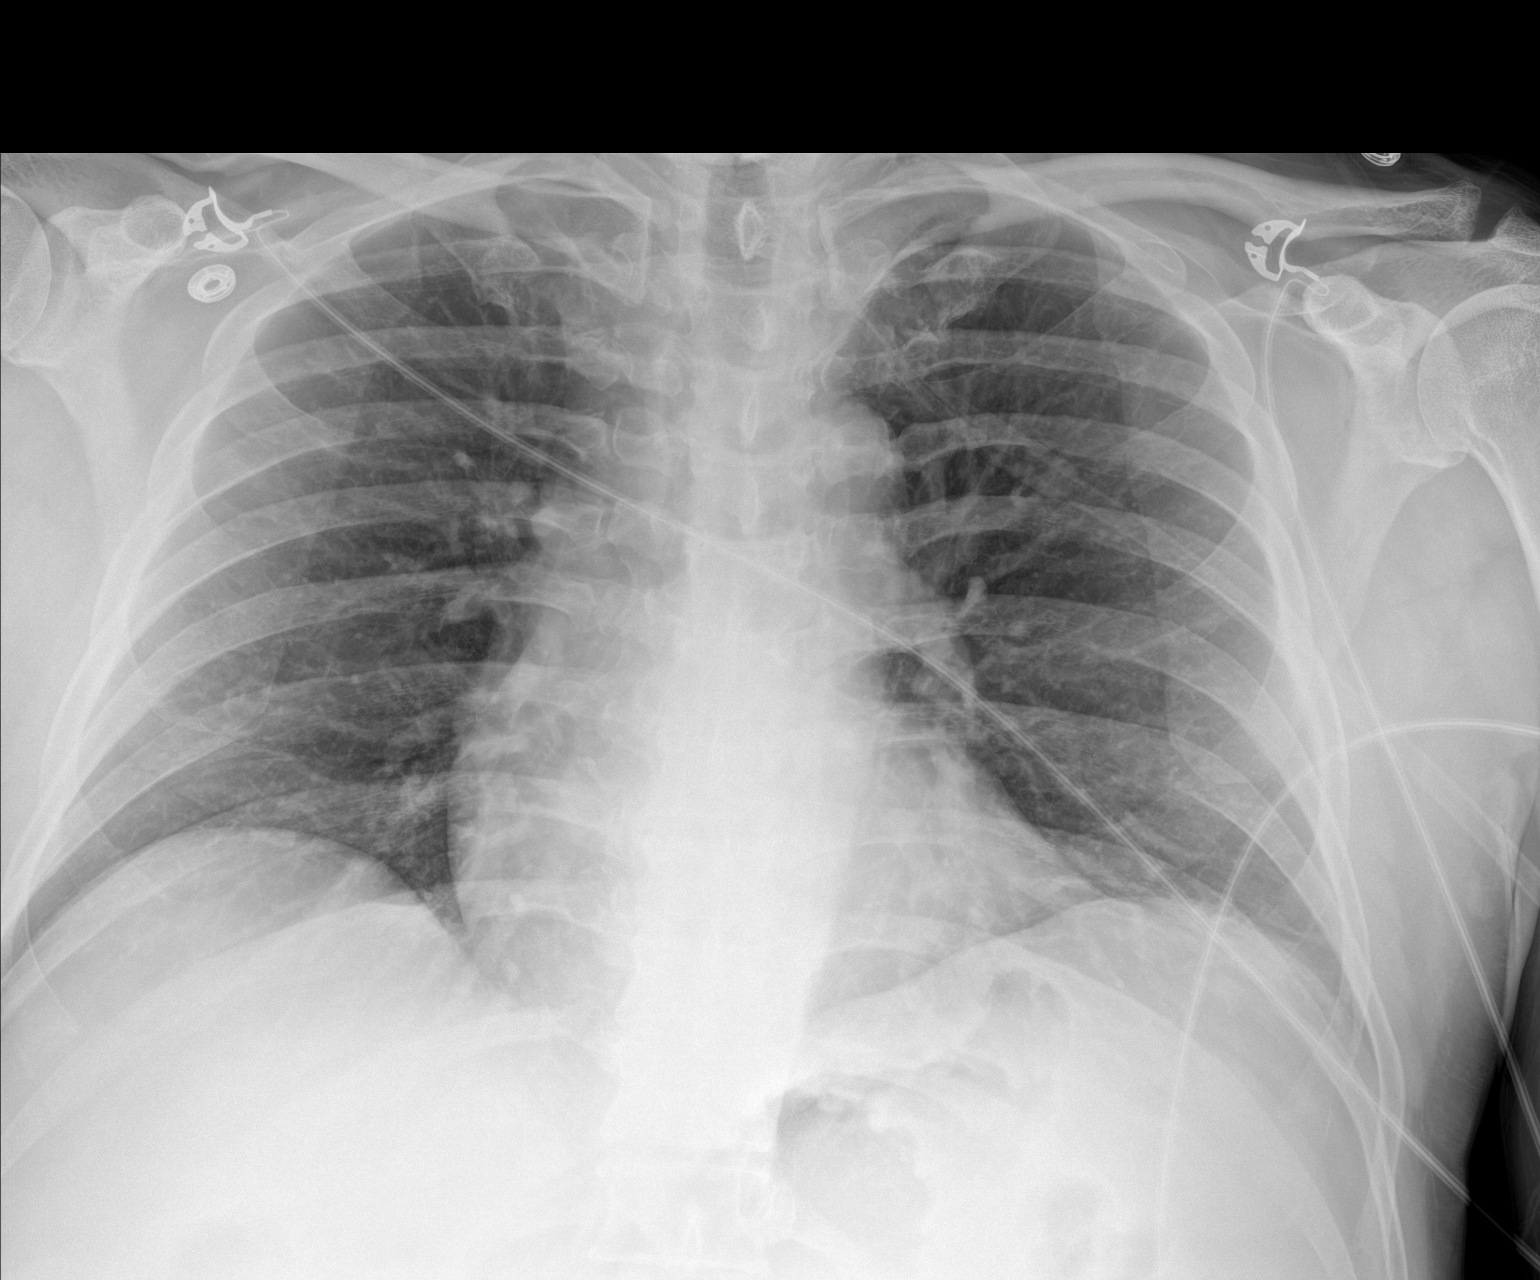

[1 of 1 positions shown; findings below may reference images not displayed]

FINDINGS: The heart size and mediastinal contours are within normal limits.
Both lungs are clear. The visualized skeletal structures are
unremarkable.
IMPRESSION: No active disease.

## 2023-03-22 DIAGNOSIS — M4802 Spinal stenosis, cervical region: Secondary | ICD-10-CM

## 2023-03-22 HISTORY — DX: Spinal stenosis, cervical region: M48.02

## 2023-05-18 ENCOUNTER — Ambulatory Visit: Payer: Self-pay

## 2023-05-18 DIAGNOSIS — Z860100 Personal history of colon polyps, unspecified: Secondary | ICD-10-CM

## 2023-05-18 DIAGNOSIS — K2950 Unspecified chronic gastritis without bleeding: Secondary | ICD-10-CM

## 2023-05-18 DIAGNOSIS — D120 Benign neoplasm of cecum: Secondary | ICD-10-CM

## 2023-05-18 DIAGNOSIS — K640 First degree hemorrhoids: Secondary | ICD-10-CM

## 2023-05-18 DIAGNOSIS — D122 Benign neoplasm of ascending colon: Secondary | ICD-10-CM

## 2023-05-18 DIAGNOSIS — K5730 Diverticulosis of large intestine without perforation or abscess without bleeding: Secondary | ICD-10-CM

## 2023-05-18 DIAGNOSIS — Z1211 Encounter for screening for malignant neoplasm of colon: Secondary | ICD-10-CM

## 2023-07-14 ENCOUNTER — Other Ambulatory Visit: Payer: Self-pay | Admitting: Family Medicine

## 2023-07-14 DIAGNOSIS — R29898 Other symptoms and signs involving the musculoskeletal system: Secondary | ICD-10-CM

## 2023-07-14 DIAGNOSIS — M542 Cervicalgia: Secondary | ICD-10-CM

## 2023-07-14 DIAGNOSIS — M79602 Pain in left arm: Secondary | ICD-10-CM

## 2023-07-16 ENCOUNTER — Ambulatory Visit
Admission: RE | Admit: 2023-07-16 | Discharge: 2023-07-16 | Disposition: A | Discharge status: Home / Self Care | Payer: Self-pay | Source: Ambulatory Visit | Attending: Family Medicine | Admitting: Family Medicine

## 2023-07-16 DIAGNOSIS — R202 Paresthesia of skin: Secondary | ICD-10-CM | POA: Insufficient documentation

## 2023-07-16 DIAGNOSIS — M542 Cervicalgia: Secondary | ICD-10-CM | POA: Insufficient documentation

## 2023-07-16 DIAGNOSIS — M79601 Pain in right arm: Secondary | ICD-10-CM | POA: Insufficient documentation

## 2023-07-16 DIAGNOSIS — M79602 Pain in left arm: Secondary | ICD-10-CM | POA: Insufficient documentation

## 2023-07-16 DIAGNOSIS — R29898 Other symptoms and signs involving the musculoskeletal system: Secondary | ICD-10-CM | POA: Insufficient documentation

## 2023-07-28 NOTE — Progress Notes (Unsigned)
 Referring Physician:  Lyle San, MD [address] [place],  [place] [address]  Primary Physician:  Lyle San, MD  History of Present Illness: 07/28/2023 [name] is here today with a chief complaint of ***  Neck pain causing numbness tingling and weakness in bilateral arms. He has decreased grip and has been dropping things.    Duration: *** Location: *** Quality: *** Severity: ***  Precipitating: aggravated by *** Modifying factors: made better by *** Weakness: none Timing: *** Bowel/Bladder Dysfunction: none  Conservative measures:  Physical therapy: has not participated in PT  Multimodal medical therapy including regular antiinflammatories: none?   Injections: no epidural steroid injections  Past Surgery: none  George Padilla has ***no symptoms of cervical myelopathy.  The symptoms are causing a significant impact on the patient's life.   I have utilized the care everywhere function in epic to review the outside records available from external health systems.  Review of Systems:  A 10 point review of systems is negative, except for the pertinent positives and negatives detailed in the HPI.  Past Medical History: Past Medical History:  Diagnosis Date   GERD (gastroesophageal reflux disease)    Hypertension    Seasonal allergies     Past Surgical History: Past Surgical History:  Procedure Laterality Date   ESOPHAGOGASTRODUODENOSCOPY N/A 11/02/2020   Procedure: ESOPHAGOGASTRODUODENOSCOPY (EGD);  Surgeon: Marnee Sink, MD;  Location: [facility] ENDOSCOPY;  Service: Endoscopy;  Laterality: N/A;   ESOPHAGOGASTRODUODENOSCOPY (EGD) WITH PROPOFOL  N/A 11/30/2020   Procedure: ESOPHAGOGASTRODUODENOSCOPY (EGD) WITH PROPOFOL ;  Surgeon: Marnee Sink, MD;  Location: [facility];  Service: Endoscopy;  Laterality: N/A;   FOOT SURGERY     HERNIA REPAIR     UPPER EXTREMITY ANGIOGRAPHY Right 06/08/2020   Procedure: UPPER EXTREMITY  ANGIOGRAPHY;  Surgeon: Celso College, MD;  Location: [facility] INVASIVE CV LAB;  Service: Cardiovascular;  Laterality: Right;    Allergies: Allergies as of 08/01/2023 - Review Complete 11/30/2020  Allergen Reaction Noted   Amoxicillin Itching 11/22/2016   Bee venom  07/11/2015   Prednisone Swelling 05/09/2018    Medications:  Current Outpatient Medications:    aspirin  EC 81 MG tablet, Take 1 tablet (81 mg total) by mouth daily. (Patient not taking: Reported on 11/11/2020), Disp: 150 tablet, Rfl: 2   escitalopram (LEXAPRO) 10 MG tablet, , Disp: , Rfl:    esomeprazole (NEXIUM) 20 MG capsule, Take by mouth., Disp: , Rfl:    fluticasone (FLONASE) 50 MCG/ACT nasal spray, Place into the nose., Disp: , Rfl:    losartan (COZAAR) 25 MG tablet, Take by mouth., Disp: , Rfl:    montelukast (SINGULAIR) 10 MG tablet, Take by mouth., Disp: , Rfl:   Social History: Social History   Tobacco Use   Smoking status: Former   Smokeless tobacco: Former  Building services engineer status: Never Used  Substance Use Topics   Alcohol use: Yes    Comment: occasionally   Drug use: No    Family Medical History: Family History  Problem Relation Age of Onset   Bladder Cancer Neg Hx    Kidney cancer Neg Hx    Prostate cancer Neg Hx     Physical Examination: There were no vitals filed for this visit.  General: Patient is in no apparent distress. Attention to examination is appropriate.  Neck:   Supple.  Full range of motion.  Respiratory: Patient is breathing without any difficulty.   NEUROLOGICAL:  Awake, alert, oriented to person, place, and time.  Speech is clear and fluent.   Cranial Nerves: Pupils equal round and reactive to light.  Facial tone is symmetric.  Facial sensation is symmetric. Shoulder shrug is symmetric. Tongue protrusion is midline.  There is no pronator drift.  Strength: Side Biceps Triceps Deltoid Interossei Grip Wrist Ext. Wrist Flex.  R 5 5 5 5 5 5 5   L 5 5 5 5 5 5 5    Side  Iliopsoas Quads Hamstring PF DF EHL  R 5 5 5 5 5 5   L 5 5 5 5 5 5    Reflexes are ***2+ and symmetric at the biceps, triceps, brachioradialis, patella and achilles.   Hoffman's is absent.   Bilateral upper and lower extremity sensation is intact to light touch.    No evidence of dysmetria noted.  Gait is normal.     Medical Decision Making  Imaging: ***  I have personally reviewed the images and agree with the above interpretation.  Assessment and Plan: Mr. Greulich is a pleasant 53 y.o. male with ***      Thank you for involving me in the care of this patient.      Orange Hilligoss K. Mont Antis MD, [facility] Neurosurgery

## 2023-07-31 ENCOUNTER — Other Ambulatory Visit: Payer: Self-pay | Admitting: Family Medicine

## 2023-07-31 ENCOUNTER — Inpatient Hospital Stay
Admission: RE | Admit: 2023-07-31 | Discharge: 2023-07-31 | Disposition: A | Discharge status: Home / Self Care | Payer: Self-pay | Source: Ambulatory Visit | Attending: Neurosurgery | Admitting: Neurosurgery

## 2023-07-31 DIAGNOSIS — Z049 Encounter for examination and observation for unspecified reason: Secondary | ICD-10-CM

## 2023-08-01 ENCOUNTER — Encounter: Payer: Self-pay | Admitting: Neurosurgery

## 2023-08-01 ENCOUNTER — Ambulatory Visit (INDEPENDENT_AMBULATORY_CARE_PROVIDER_SITE_OTHER): Payer: Self-pay | Admitting: Neurosurgery

## 2023-08-01 VITALS — BP 122/72 | Ht 68.0 in | Wt 198.0 lb

## 2023-08-01 DIAGNOSIS — M5412 Radiculopathy, cervical region: Secondary | ICD-10-CM

## 2023-08-01 DIAGNOSIS — M25511 Pain in right shoulder: Secondary | ICD-10-CM

## 2023-08-01 DIAGNOSIS — G8929 Other chronic pain: Secondary | ICD-10-CM

## 2023-08-01 DIAGNOSIS — R202 Paresthesia of skin: Secondary | ICD-10-CM

## 2023-08-01 DIAGNOSIS — R200 Anesthesia of skin: Secondary | ICD-10-CM

## 2023-08-08 ENCOUNTER — Other Ambulatory Visit: Payer: Self-pay

## 2023-08-08 ENCOUNTER — Encounter: Payer: Self-pay | Admitting: Neurology

## 2023-08-08 DIAGNOSIS — R202 Paresthesia of skin: Secondary | ICD-10-CM

## 2023-08-25 ENCOUNTER — Ambulatory Visit (INDEPENDENT_AMBULATORY_CARE_PROVIDER_SITE_OTHER): Payer: Self-pay | Admitting: Neurology

## 2023-08-25 DIAGNOSIS — M5412 Radiculopathy, cervical region: Secondary | ICD-10-CM

## 2023-08-25 DIAGNOSIS — R202 Paresthesia of skin: Secondary | ICD-10-CM

## 2023-08-25 NOTE — Procedures (Signed)
  [facility]  [address] [place], [address]  [place], [place] [address] Tel: [phone] Fax: [phone] Test Date:  08/25/2023  Patient: George Padilla DOB: Nov 30, 1970 Physician: Reyna Cava, DO  Sex: Male Height: 5\' 8"  Ref Phys: Jodeen Munch, MD  ID#: [id number]   Technician:    History: This is a 53 year old man referred for evaluation of right upper extremity pain and paresthesias.  NCV & EMG Findings: Extensive electrodiagnostic testing of the right upper extremity shows:  Right median, ulnar, and mixed palmar sensory responses are within normal limits. Right median and ulnar motor responses are within normal limits. 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 There is no evidence of carpal tunnel syndrome or an ulnar neuropathy affecting the right upper extremity.   ___________________________ Reyna Cava, DO    Nerve Conduction Studies   Stim Site NR Peak (ms) Norm Peak (ms) O-P Amp (V) Norm O-P Amp  Right Median Anti Sensory (2nd Digit)  32 C  Wrist    2.9 <3.6 23.2 >15  Right Ulnar Anti Sensory (5th Digit)  32 C  Wrist    2.8 <3.1 15.4 >10     Stim Site NR Onset (ms) Norm Onset (ms) O-P Amp (mV) Norm O-P Amp Site1 Site2 Delta-0 (ms) Dist (cm) Vel (m/s) Norm Vel (m/s)  Right Median Motor (Abd Poll Brev)  32 C  Wrist    2.9 <4.0 10.0 >6 Elbow Wrist 5.4 33.0 61 >50  Elbow    8.3  9.8         Right Ulnar Motor (Abd Dig Minimi)  32 C  Wrist    2.5 <3.1 9.8 >7 B Elbow Wrist 3.9 23.0 59 >50  B Elbow    6.4  9.1  A Elbow B Elbow 1.6 10.0 63 >50  A Elbow    8.0  8.8            Stim Site NR Peak (ms) Norm Peak (ms) P-T Amp (V) Site1 Site2 Delta-P (ms) Norm Delta (ms)  Right Median/Ulnar Palm Comparison (Wrist - 8cm)  32 C  Median Palm    1.7 <2.2 57.2 Median  Palm Ulnar Palm 0.2   Ulnar Palm    1.5 <2.2 7.0       Electromyography   Side Muscle Ins.Act Fibs Fasc Recrt Amp Dur Poly Activation Comment  Right 1stDorInt Nml Nml Nml Nml Nml Nml Nml Nml N/A  Right PronatorTeres Nml Nml Nml *1- *1+ *1+ *1+ Nml N/A  Right Biceps Nml Nml Nml *1- *1+ *1+ *1+ Nml N/A  Right Triceps Nml Nml Nml *2- *1+ *1+ *1+ Nml N/A  Right Deltoid Nml Nml Nml *1- *1+ *1+ *1+ Nml N/A  Right Cervical Parasp Low Nml Nml Nml Nml Nml Nml Nml Nml N/A      Waveforms:

## 2023-08-29 ENCOUNTER — Ambulatory Visit: Payer: Self-pay | Admitting: Neurosurgery

## 2023-09-07 ENCOUNTER — Ambulatory Visit: Payer: Self-pay | Admitting: Neurosurgery

## 2023-09-18 ENCOUNTER — Encounter: Payer: Self-pay | Admitting: Neurology

## 2023-10-06 NOTE — H&P (Signed)
 ------------------------------------------------------------------------------- Attestation signed by Evalene Argyle, MD at 10/07/2023  2:25 AM (Updated) Patient seen and evaluated with Dr. Donnel. Appreciate her work and documentation, which was reviewed. Agree; patient with neurologic deficits with degenerative cervical spine disease without overt fracture per radiologist read. Appreciate Neurosurgery's prompt evaluation; on their evaluation his exam had slightly improved. MRIs are pending, and he will be admitted to the ICU for neuromonitoring and hemodynamic monitoring in the context of possible spinal cord injury superimposed on chronic cervical spine disease. His pre-hospital hypotension is likely multifactorial with a combination of dehydration and neurogenic shock in the context of no overt identified injury to result in hemorrhagic shock, environmental conditions and neurologic deficits. He was given a balanced crystalloid and has not been with recurrent hypotension thus far, but will be monitored closed; an arterial line will be placed and inotropic+vasoactive gtt started should recurrent hypotension occur in the face of cervical spine level neurologic deficit.  Formal films of his RUE are pending to evaluate for fracture in the context of pain. If no fracture, compartments will be serially examined in addition to his neurovascular exam; currently with soft compartments and without overt distal paresthesias and with 2+ radial pulses.  Incidental findings as noted. Additionally with hepatic lesions. Will need to be formally informed by team during tertiary survey. Ongoing tertiary survey.  Additionally, with supranormal creatinine without marked electrolyte disarray or elevated BUN; unclear baseline. But, likely represent acute kidney injury, present on admission in this context. Will support, trend and avoid nephrotoxic agents as  able. -------------------------------------------------------------------------------  TRAUMA SURGERY HISTORY AND PHYSICAL  Pt. Name: George Padilla         MRN: [id number]          Date of Birth: 03/21/1971  DATE OF ADMISSION:   10/06/2023  TRAUMA CODE: 1   METHOD OF TRANSPORTATION:  Ambulance   DATE/TIME OF INJURY:  10/06/23   TRANSFERRING HOSPITAL:  N/A   [facility] ARRIVAL DATE/TIME:  10/06/23 2113  AGE: 53 y.o.   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 There were several witnessed episodes of loss of consciousness but no headstrike. He was ambulatory on scene however described a sudden onset left upper extremity at 2053 hrs. Of note, he has a history of cervical stenosis and degenerative disc disease. He denies use of any blood thinners.   PRE HOSPITAL VITALS: BP: 64/43, 103/55    Highest HR: 92     Best GCS: 15    SpO2: 92% on 4L [place]  Pre-hospital fluids/meds fentanyl     C-SPINE IMMOBILIZATION: Hard Collar   PAST MEDICAL HISTORY: Cervical Stenosis Degenerative Disc Disease  Htn GERD   PAST SURGICAL HISTORY:   Bilateral inguinal hernia repair  R arm blood clot removal  Left foot surgery    ALLERGIES: Penicillin     HOME MEDICATIONS:  PPI, montelukast, escitalopram, spironolactone, losartan-hydrochlorothiazide    SOCIAL HISTORY:  Denies drug or tobacco use. Endorses daily ETOH use.   Social Drivers of Health   Living Situation: Not on file  Food Insecurity: Not on file  Transportation Needs: Not on file  Utilities: Not on file  Alcohol Screening: Not on file  Tobacco Use: Not on file  Depression: Not on file    REVIEW OF SYSTEMS:   POSITIVE FOR: right arm  pain, left hand and left leg numbness  12 point review of systems otherwise negative.  Vitals:   10/06/23 2200 10/06/23 2205  10/06/23 2210 10/06/23 2215  BP:    122/71  Pulse: 104 104 103 102  Resp: 14 16 17  (!) 22  Temp:      TempSrc:      SpO2: 98% 94% 94% 95%  Weight:         TRUAMA PRIMARY SURVEY   Airway:  Clear and intact   Breathing:  Spontaneous, symmetric bilaterally    Circulation:  Warm, dry.  Carotid 2+ bilaterally, femoral 2+ bilaterally   Disability:  GCS13 (E3, V4,M6).  Pupils 3 mm, reactive bilaterally   INITIAL EMERGENCY DEPARTMENT VITALS SIGNS:   HR 83 BP 107/67 RR 17 SpO2 97% on 4L [place]  Temp 99  TRAUMA SECONDARY SURVEY GEN: Alert and Oriented, No acute distress HEAD: Normocephalic, no lacerations or evidence of external trauma EYES: anicteric, normal sclera without injection. Gaze normal. Extra-ocular motion intact EARS: normal hearing, no lesions NOSE: no lacerations or epistaxis FACE: No facial bony instability or lacerations MOUTH: no bleeding or laceration. No jaw malocclusion NECK: Supple, no TTP and trachea midline  CHEST: symmetric expansion, no crepitus, no TTP RESPIRATORY: Symmetric and non-labored respirations, no cyanosis CV: Normal peripheral perfusion, no edema ABD: Soft, Non-tender, mild distension. Normal contour.  PELVIS:  Stable, nontender. No bony crepitus GENITOURINARY:  No blood at meatus.  Normal external genitalia. RECTAL:  normal tone, no bleeding or laceration MSK/EXTREMITIES: RUE in posterior slab splint , with scattered surface abrasions to the dorsal aspect of the hand and forearm and right wrist swelling. Decreased sensation to touch on LLE, 0/5 motor strength on LLE ankle dorsiflexion, plantarflexion, knee flexion, and hip flexion, 3/5 strength at left elbow, 0/5 left grip strength.  BACK AND SPINE:  Cervical, thoracic and lumbar non tender without step offs or deformity . NEURO: Alert, Oriented  PSYCH: Cooperative, Appropriate affect SKIN: Warm, Dry, no rashes or lacerations VASCULAR:  2+ radial and DP pulses bilaterally.   IMAGING:   CT Spine  Lumbar Reformats WO Contrast Narrative: DATE OF SERVICE: 10/06/2023 10:00 pm  EXAM: CT CHEST W CONTRAST; CT ABDOMEN PELVIS W CONTRAST; CT SPINE LUMBAR REFORMATS WO CONTRAST; CT SPINE THORACIC REFORMATS WO CONTRAST  CLINICAL HISTORY: Chest trauma, cardiac injury suspected, chest trauma, cardiac injury suspected; Abdominal Trauma; Back trauma; Back Trauma  COMPARISON: No Comparison.  TECHNIQUE: Following administration of IV contrast, axial images were acquired through the chest, abdomen, and pelvis. Dedicated reformatted imaging of the thoracolumbar spine was also performed. Coronal and sagittal reformatted images were obtained from the axial data.  Dose lowering technique(s) such as automated exposure control, iterative reconstruction, and mA and/or KV adjustment for patient size was utilized for this exam.  CONTRAST: iohexoL (OMNIPAQUE) 350 mg iodine /mL injection (MDV) 100 mL - amount administered: 100 mL  //  FINDINGS: Chest: The heart is normal in size.There is no thoracic aortic dissection or aneurysm. The pulmonary arteries demonstrate no central embolus, given limitation for technique.  The central airways are patent. There is dependent atelectasis in the lungs. There is no consolidation or pleural effusion. There is no pneumothorax. There is a 5 mm ground-glass nodule in the right upper lobe.  There is no mediastinal lymphadenopathy.  Abdomen/pelvis: There is a 5.7 cm cyst of the dome of the liver. There are additional subcentimeter hypoattenuating lesions which are too small to characterize, likely reflect small cysts or hemangiomas in a low  risk patient. The gallbladder is normal.  The spleen, pancreas, and adrenal glands demonstrate no significant abnormality.  The kidneys symmetrically enhance. There is no hydronephrosis. There is a 1.8 cm hypoattenuating exophytic lesion at the lateral left kidney.  There is no bowel obstruction or inflammatory change. The appendix is  normal. There is sigmoid diverticulosis without CT evidence of acute diverticulitis. There is no free intraperitoneal air or ascites.  There is atheromatous plaque of the abdominal aorta without aneurysmal dilation or dissection.The urinary bladder is unremarkable for technique. The prostate gland is nonenlarged.  There is no acute fracture.Dedicated reformatted imaging of the thoracolumbar spine demonstrates vertebral body heights and alignment are within normal limits. Impression: 1. No acute abnormality of the chest. 2. No acute abnormality of the abdomen or pelvis. 3. No acute abnormality of the thoracolumbar spine. 4. 5 mm ground-glass nodule in the right upper lobe. No further investigation recommended (Per Fleischner Society guidelines). 5. 1.8 cm hypoattenuating exophytic lesion at the lateral left kidney. Recommend further evaluation with nonemergent renal ultrasound.  THIS IS AN ELECTRONICALLY VERIFIED FINAL REPORT 10/06/2023 10:21 PM - Electronically signed by Ike Dolores  Workstation: [id number] [facility] CT Spine Thoracic Reformats WO Contrast Narrative: DATE OF SERVICE: 10/06/2023 10:00 pm  EXAM: CT CHEST W CONTRAST; CT ABDOMEN PELVIS W CONTRAST; CT SPINE LUMBAR REFORMATS WO CONTRAST; CT SPINE THORACIC REFORMATS WO CONTRAST  CLINICAL HISTORY: Chest trauma, cardiac injury suspected, chest trauma, cardiac injury suspected; Abdominal Trauma; Back trauma; Back Trauma  COMPARISON: No Comparison.  TECHNIQUE: Following administration of IV contrast, axial images were acquired through the chest, abdomen, and pelvis. Dedicated reformatted imaging of the thoracolumbar spine was also performed. Coronal and sagittal reformatted images were obtained from the axial data.  Dose lowering technique(s) such as automated exposure control, iterative reconstruction, and mA and/or KV adjustment for patient size was utilized for this exam.  CONTRAST: iohexoL (OMNIPAQUE) 350 mg iodine /mL  injection (MDV) 100 mL - amount administered: 100 mL  //  FINDINGS: Chest: The heart is normal in size.There is no thoracic aortic dissection or aneurysm. The pulmonary arteries demonstrate no central embolus, given limitation for technique.  The central airways are patent. There is dependent atelectasis in the lungs. There is no consolidation or pleural effusion. There is no pneumothorax. There is a 5 mm ground-glass nodule in the right upper lobe.  There is no mediastinal lymphadenopathy.  Abdomen/pelvis: There is a 5.7 cm cyst of the dome of the liver. There are additional subcentimeter hypoattenuating lesions which are too small to characterize, likely reflect small cysts or hemangiomas in a low risk patient. The gallbladder is normal.  The spleen, pancreas, and adrenal glands demonstrate no significant abnormality.  The kidneys symmetrically enhance. There is no hydronephrosis. There is a 1.8 cm hypoattenuating exophytic lesion at the lateral left kidney.  There is no bowel obstruction or inflammatory change. The appendix is normal. There is sigmoid diverticulosis without CT evidence of acute diverticulitis. There is no free intraperitoneal air or ascites.  There is atheromatous plaque of the abdominal aorta without aneurysmal dilation or dissection.The urinary bladder is unremarkable for technique. The prostate gland is nonenlarged.  There is no acute fracture.Dedicated reformatted imaging of the thoracolumbar spine demonstrates vertebral body heights and alignment are within normal limits. Impression: 1. No acute abnormality of the chest. 2. No acute abnormality of the abdomen or pelvis. 3. No acute abnormality of the thoracolumbar spine. 4. 5 mm ground-glass nodule in the right upper lobe. No further investigation  recommended (Per Fleischner Society guidelines). 5. 1.8 cm hypoattenuating exophytic lesion at the lateral left kidney. Recommend further evaluation with nonemergent renal  ultrasound.  THIS IS AN ELECTRONICALLY VERIFIED FINAL REPORT 10/06/2023 10:21 PM - Electronically signed by Ike Dolores  Workstation: [id number] [facility] CT Abdomen Pelvis W Contrast Narrative: DATE OF SERVICE: 10/06/2023 10:00 pm  EXAM: CT CHEST W CONTRAST; CT ABDOMEN PELVIS W CONTRAST; CT SPINE LUMBAR REFORMATS WO CONTRAST; CT SPINE THORACIC REFORMATS WO CONTRAST  CLINICAL HISTORY: Chest trauma, cardiac injury suspected, chest trauma, cardiac injury suspected; Abdominal Trauma; Back trauma; Back Trauma  COMPARISON: No Comparison.  TECHNIQUE: Following administration of IV contrast, axial images were acquired through the chest, abdomen, and pelvis. Dedicated reformatted imaging of the thoracolumbar spine was also performed. Coronal and sagittal reformatted images were obtained from the axial data.  Dose lowering technique(s) such as automated exposure control, iterative reconstruction, and mA and/or KV adjustment for patient size was utilized for this exam.  CONTRAST: iohexoL (OMNIPAQUE) 350 mg iodine /mL injection (MDV) 100 mL - amount administered: 100 mL  //  FINDINGS: Chest: The heart is normal in size.There is no thoracic aortic dissection or aneurysm. The pulmonary arteries demonstrate no central embolus, given limitation for technique.  The central airways are patent. There is dependent atelectasis in the lungs. There is no consolidation or pleural effusion. There is no pneumothorax. There is a 5 mm ground-glass nodule in the right upper lobe.  There is no mediastinal lymphadenopathy.  Abdomen/pelvis: There is a 5.7 cm cyst of the dome of the liver. There are additional subcentimeter hypoattenuating lesions which are too small to characterize, likely reflect small cysts or hemangiomas in a low risk patient. The gallbladder is normal.  The spleen, pancreas, and adrenal glands demonstrate no significant abnormality.  The kidneys symmetrically enhance. There is no  hydronephrosis. There is a 1.8 cm hypoattenuating exophytic lesion at the lateral left kidney.  There is no bowel obstruction or inflammatory change. The appendix is normal. There is sigmoid diverticulosis without CT evidence of acute diverticulitis. There is no free intraperitoneal air or ascites.  There is atheromatous plaque of the abdominal aorta without aneurysmal dilation or dissection.The urinary bladder is unremarkable for technique. The prostate gland is nonenlarged.  There is no acute fracture.Dedicated reformatted imaging of the thoracolumbar spine demonstrates vertebral body heights and alignment are within normal limits. Impression: 1. No acute abnormality of the chest. 2. No acute abnormality of the abdomen or pelvis. 3. No acute abnormality of the thoracolumbar spine. 4. 5 mm ground-glass nodule in the right upper lobe. No further investigation recommended (Per Fleischner Society guidelines). 5. 1.8 cm hypoattenuating exophytic lesion at the lateral left kidney. Recommend further evaluation with nonemergent renal ultrasound.  THIS IS AN ELECTRONICALLY VERIFIED FINAL REPORT 10/06/2023 10:21 PM - Electronically signed by Ike Dolores  Workstation: [id number] [facility] CT Chest W Contrast Narrative: DATE OF SERVICE: 10/06/2023 10:00 pm  EXAM: CT CHEST W CONTRAST; CT ABDOMEN PELVIS W CONTRAST; CT SPINE LUMBAR REFORMATS WO CONTRAST; CT SPINE THORACIC REFORMATS WO CONTRAST  CLINICAL HISTORY: Chest trauma, cardiac injury suspected, chest trauma, cardiac injury suspected; Abdominal Trauma; Back trauma; Back Trauma  COMPARISON: No Comparison.  TECHNIQUE: Following administration of IV contrast, axial images were acquired through the chest, abdomen, and pelvis. Dedicated reformatted imaging of the thoracolumbar spine was also performed. Coronal and sagittal reformatted images were obtained from the axial data.  Dose lowering technique(s) such as automated exposure control,  iterative reconstruction, and mA and/or KV  adjustment for patient size was utilized for this exam.  CONTRAST: iohexoL (OMNIPAQUE) 350 mg iodine /mL injection (MDV) 100 mL - amount administered: 100 mL  //  FINDINGS: Chest: The heart is normal in size.There is no thoracic aortic dissection or aneurysm. The pulmonary arteries demonstrate no central embolus, given limitation for technique.  The central airways are patent. There is dependent atelectasis in the lungs. There is no consolidation or pleural effusion. There is no pneumothorax. There is a 5 mm ground-glass nodule in the right upper lobe.  There is no mediastinal lymphadenopathy.  Abdomen/pelvis: There is a 5.7 cm cyst of the dome of the liver. There are additional subcentimeter hypoattenuating lesions which are too small to characterize, likely reflect small cysts or hemangiomas in a low risk patient. The gallbladder is normal.  The spleen, pancreas, and adrenal glands demonstrate no significant abnormality.  The kidneys symmetrically enhance. There is no hydronephrosis. There is a 1.8 cm hypoattenuating exophytic lesion at the lateral left kidney.  There is no bowel obstruction or inflammatory change. The appendix is normal. There is sigmoid diverticulosis without CT evidence of acute diverticulitis. There is no free intraperitoneal air or ascites.  There is atheromatous plaque of the abdominal aorta without aneurysmal dilation or dissection.The urinary bladder is unremarkable for technique. The prostate gland is nonenlarged.  There is no acute fracture.Dedicated reformatted imaging of the thoracolumbar spine demonstrates vertebral body heights and alignment are within normal limits. Impression: 1. No acute abnormality of the chest. 2. No acute abnormality of the abdomen or pelvis. 3. No acute abnormality of the thoracolumbar spine. 4. 5 mm ground-glass nodule in the right upper lobe. No further investigation recommended (Per  Fleischner Society guidelines). 5. 1.8 cm hypoattenuating exophytic lesion at the lateral left kidney. Recommend further evaluation with nonemergent renal ultrasound.  THIS IS AN ELECTRONICALLY VERIFIED FINAL REPORT 10/06/2023 10:21 PM - Electronically signed by Ike Dolores  Workstation: [id number] [facility] CT Angio Neck Narrative: DATE OF SERVICE: 10/06/2023 10:00 pm  EXAM: CT ANGIO NECK  CLINICAL HISTORY: trauma  COMPARISON: No Comparison.  TECHNIQUE: Intravenous contrast material was administered, and images were obtained through the neck to the aortic arch per standard CTA protocol. 3D postprocessing was performed including multiplanar reformatted, MIP, and volume rendered images.  Measurement of carotid narrowing is based on NASCET stenosis criteria using the caliber of the distal internal carotid artery as a reference.  Dose lowering technique(s) such as automated exposure control, iterative reconstruction, and mA and/or KV adjustment for patient size was utilized for this exam.  3D and MIP reformations and post-processing reconstructions were performed and interpreted  CONTRAST DOSE: Omnipaque-350 IV 65 mL.  FINDINGS: No dissections. No occlusions or flow-limiting lesions. Predominantly positive remodeling of soft plaque at bilateral bifurcations.  No acute findings in the soft tissues of the neck.  No acute osseous abnormalities.  Included lungs are unremarkable. Impression: No flow-limiting lesions.  THIS IS AN ELECTRONICALLY VERIFIED FINAL REPORT 10/06/2023 10:17 PM - Electronically signed by Ozell Forts  Workstation: [id number] [facility] CT Spine Cervical WO Contrast Narrative: DATE OF SERVICE: 10/06/2023 10:00 pm  EXAM: CT SPINE CERVICAL WO CONTRAST  CLINICAL HISTORY: Neck Trauma  COMPARISON: No Comparison.  TECHNIQUE: Axial CT images of cervical spine were obtained without intravenous contrast. Orthogonal reformatted images  were generated.  Dose lowering technique(s) such as automated exposure control, iterative reconstruction, and mA and/or KV adjustment for patient size was utilized for this exam.  FINDINGS: No fractures. No acute traumatic subluxations.  Mild-to-moderate degenerative changes at C5-6 and C6-7 with circumferential disc osteophyte complexes.  Included soft tissues of the neck are unremarkable. Impression: No acute traumatic findings.  THIS IS AN ELECTRONICALLY VERIFIED FINAL REPORT 10/06/2023 10:14 PM - Electronically signed by Ozell Forts  Workstation: [id number] [facility] CT Head WO Contrast Narrative: DATE OF SERVICE: 10/06/2023 10:00 pm  EXAM: CT HEAD WO CONTRAST  CLINICAL HISTORY: Head trauma, injury suspected  COMPARISON: No Comparison.  TECHNIQUE: Axial images were obtained from the base of the skull to the vertex without IV contrast.  Dose lowering technique(s) such as automated exposure control, iterative reconstruction, and mA and/or KV adjustment for patient size was utilized for this exam.  FINDINGS: BRAIN PARENCHYMA: No hemorrhage, mass effect, or midline shift. The gray-white matter differentiation is normal. 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  ORBITS: Normal.  SINUSES: No inflammatory changes.  MASTOIDS: Clear.  Extracalvarial soft tissues are unremarkable. Impression: No acute intracranial abnormality.  THIS IS AN ELECTRONICALLY VERIFIED FINAL REPORT 10/06/2023 10:06 PM - Electronically signed by Ozell Forts  Workstation: [id number] [facility] XR Chest 1 View Narrative: DATE OF SERVICE: 10/06/2023 9:35 pm  EXAM: CHEST ONE VIEW  CLINICAL HISTORY: 53 y/o  M is evaluated for  Trauma.  COMPARISON: No Comparison.  FINDINGS: Shallow inspiratory depth has accentuated the cardiomediastinal silhouette and bronchovascular markings. Lungs: Negative for focal airspace opacities.  Bilateral linear opacities are noted, suggestive of atelectasis. Pleura: Negative for pleural effusion. Negative for pneumothorax. Cardiomediastinal silhouette: Mild cardiomegaly is noted. Mild pulmonary vascular congestion is suggested. Osseous structures:   No apparent acute osseous abnormality. Other findings: Negative for unexpected radiopaque foreign body. Impression: Overall low lung volume with mild cardiomegaly and mild pulmonary vascular congestion.  THIS IS AN ELECTRONICALLY VERIFIED FINAL REPORT 10/06/2023 9:59 PM - Electronically signed by Cloyd Carl  Workstation: [id number] [facility] XR Forearm 2 Views Right Narrative: DATE OF SERVICE: 10/06/2023 9:35 pm  EXAM: RIGHT FOREARM, one view  CLINICAL HISTORY: trauma  COMPARISON: No Comparison.  FINDINGS: A degraded single view of the RIGHT forearm was submitted for review. No definite acute fracture or dislocation. Soft tissue edema is noted at the proximal forearm. Multiple linear foreign bodies are noted at the distal ulnar head, which may represent surgical sutures or external. Impression: No definite acute fracture or dislocation.  THIS IS AN ELECTRONICALLY VERIFIED FINAL REPORT 10/06/2023 9:57 PM - Electronically signed by Cloyd Carl  Workstation: [id number]     LABS:   Lab Results  Component Value Date   WBC 9.81 10/06/2023   HGB 14.1 10/06/2023   HCT 40 10/06/2023   MCV 90 10/06/2023   PLT 322 10/06/2023   Lab Results  Component Value Date   GLUCOSE 145 (H) 10/06/2023   CALCIUM 9.0 10/06/2023   NA 137 10/06/2023   K 3.7 10/06/2023   CO2 18 (L) 10/06/2023   CL 102 10/06/2023   BUN 22 10/06/2023   CREATININE 2.42 (H) 10/06/2023    C spine clearance:  C Collar    PROBLEM LIST:   LLE and LUE exam significant for motor and sensory deficits  - no cute fracture on CT  - Obtain STAT Mri to evaluate for ligamentous injury  - Admit to ICU for monitoring   Incidental  Findings:  - 5 mm ground-glass nodule in the right upper lobe. No further investigation recommended (Per Fleischner Society guidelines). - 1.8 cm hypoattenuating exophytic lesion at the lateral left kidney. Recommend further  evaluation with nonemergent renal ultrasound.  CONSULTATIONS:  NSYG   DISPOSITION: Admit to STICU   Emmanuelle Shanda Gammons, M4 10/06/2023 9:54pm   Almarie Raw, MD   For all questions 24/7: message "[facility] Trauma Intern or [facility] Trauma Team  Please do NOT message individual providers directly - use the team group

## 2023-10-26 NOTE — Progress Notes (Deleted)
 SABRA

## 2023-10-31 ENCOUNTER — Other Ambulatory Visit: Payer: Self-pay

## 2023-10-31 ENCOUNTER — Inpatient Hospital Stay
Admission: RE | Admit: 2023-10-31 | Discharge: 2023-10-31 | Disposition: A | Discharge status: Home / Self Care | Payer: Self-pay | Source: Ambulatory Visit | Attending: Neurosurgery | Admitting: Neurosurgery

## 2023-10-31 ENCOUNTER — Ambulatory Visit: Payer: Self-pay | Admitting: Neurosurgery

## 2023-10-31 ENCOUNTER — Other Ambulatory Visit: Payer: Self-pay | Admitting: Family Medicine

## 2023-10-31 ENCOUNTER — Encounter: Payer: Self-pay | Admitting: Neurosurgery

## 2023-10-31 VITALS — BP 110/62 | Ht 68.0 in | Wt 201.0 lb

## 2023-10-31 DIAGNOSIS — Z01818 Encounter for other preprocedural examination: Secondary | ICD-10-CM

## 2023-10-31 DIAGNOSIS — Z049 Encounter for examination and observation for unspecified reason: Secondary | ICD-10-CM

## 2023-10-31 DIAGNOSIS — M5412 Radiculopathy, cervical region: Secondary | ICD-10-CM

## 2023-10-31 DIAGNOSIS — R29898 Other symptoms and signs involving the musculoskeletal system: Secondary | ICD-10-CM

## 2023-10-31 NOTE — Patient Instructions (Signed)
 Please see below for information in regards to your upcoming surgery:   Planned surgery: C5-7 anterior cervical discectomy and fusion   Surgery date: 11/13/23 at [facility] (Medical Mall: [address], [place], [place] [address]) - you will find out your arrival time the business day before your surgery.   Pre-op appointment at [facility] Pre-admit Testing: you will receive a call with a date/time for this appointment. If you are scheduled for an in person appointment, Pre-admit Testing is located on the first floor of the [facility] building, [address] [facility] [address], [address]. During this appointment, they will advise you which medications you can take the morning of surgery, and which medications you will need to hold for surgery. Labs (such as blood work, EKG) may be done at your pre-op appointment. You are not required to fast for these labs. Should you need to change your pre-op appointment, please call Pre-admit testing at [phone].      Surgical clearance: we will send a clearance form to Dr Valora. They may wish to see you in their office prior to signing the clearance form. If so, they may call you to schedule an appointment.    NSAIDS (Non-steroidal anti-inflammatory drugs): because you are having a fusion, please avoid taking any NSAIDS (examples: ibuprofen, motrin, aleve, naproxen, meloxicam, diclofenac) for 3 months after surgery. Celebrex is an exception and is OK to take, if prescribed. Tylenol  is not an NSAID.    Common restrictions after spine surgery: No bending, lifting, or twisting ("BLT"). Avoid lifting objects heavier than 10 pounds for the first 6 weeks after surgery. Where possible, avoid household activities that involve lifting, bending, reaching, pushing, or pulling such as laundry, vacuuming, grocery shopping, and childcare. Try to arrange for help from friends and family for these activities while you heal. Do not drive while  taking prescription pain medication. Weeks 6 through 12 after surgery: avoid lifting more than 25 pounds.    X-rays after surgery: Because you are having a fusion: for appointments after your 2 week follow-up: please arrive at the [facility] outpatient imaging center ([address], [place]) or [facility] one hour prior to your appointment for x-rays. This applies to every appointment after your 2 week follow-up. Failure to do so may result in your appointment being rescheduled. *We recently started construction to have x-ray in our office. This may be completed by the time you come in for your 6 week post-op appointment. Please check with us  closer to that time to see if you can have your x-rays at our office*    How to contact us :  If you have any questions/concerns before or after surgery, you can reach us  at [phone], or you can send a mychart message. We can be reached by phone or mychart 8am-4pm, Monday-Friday.  *Please note: Calls after 4pm are forwarded to a third party answering service. Mychart messages are not routinely monitored during evenings, weekends, and holidays. Please call our office to contact the answering service for urgent concerns during non-business hours.   If you have FMLA/disability paperwork, please drop it off or fax it to [phone]   Appointments/FMLA & disability paperwork: [employer] Registered Nurse/Surgery scheduler: Markeya Mincy, RN Certified Medical Assistants: Don, CMA, Elenor, CMA, & Damien, CMA Physician Assistants: Lyle Decamp, PA-C, Edsel Goods, PA-C & Glade Boys, PA-C Surgeons: Penne Sharps, MD & Reeves Daisy, MD   [facility] PREADMIT TESTING VISIT and SURGERY  INFORMATION SHEET   Now that surgery has been scheduled you can anticipate several phone calls from [facility]. A pharmacy technician will call you to verify your current list of medications taken  at home.               The Pre-Service Center will call to verify your insurance information and to give you billing estimates and information.             The Preadmit Testing Office will be calling to schedule a visit to obtain information for the anesthesia team and provide instructions on preparation for surgery.  What can you expect for the Preadmit Testing Visit: Appointments may be scheduled in-person or by telephone.  If a telephone visit is scheduled, you may be asked to come into the office to have lab tests or other studies performed.   This visit will not be completed any greater than 14 days prior to your surgery.  If your surgery has been scheduled for a future date, please do not be alarmed if we have not contacted you to schedule an appointment more than a month prior to the surgery date.    Please be prepared to provide the following information during this appointment:            -Personal medical history                                               -Medication and allergy list            -Any history of problems with anesthesia              -Recent lab work or diagnostic studies            -Please notify us  of any needs we should be aware of to provide the best care possible           -You will be provided with instructions on how to prepare for your surgery.    On The Day of Surgery:  You must have a driver to take you home after surgery, you will be asked not to drive for 24 hours following surgery.  Taxi, Gisele and non-medical transport will not be acceptable means of transportation unless you have a responsible individual who will be traveling with you.  Visitors in the surgical area:   2 people will be able to visit you in your room once your preparation for surgery has been completed. During surgery, your visitors will be asked to wait in the Surgery Waiting Area.  It is not a requirement for them to stay, if they prefer to leave and come back.  Your visitor(s) will be  given an update once the surgery has been completed.  No visitors are allowed in the initial recovery room to respect patient privacy and safety.  Once you are more awake and transfer to the secondary recovery area, or are transferred to an inpatient room, visitors will again be able to see you.  To respect and protect your privacy: We will ask on the day of surgery who your driver will be and what the contact number for that individual will be. We will ask if it is okay to share information with this individual, or if there is an alternative individual that we, or the surgeon, should contact to provide updates  and information. If family or friends come to the surgical information desk requesting information about you, who you have not listed with us , no information will be given.   It may be helpful to designate someone as the main contact who will be responsible for updating your other friends and family.    PREADMIT TESTING OFFICE: [phone] SAME DAY SURGERY: [phone] We look forward to caring for you before and throughout the process of your surgery.

## 2023-10-31 NOTE — H&P (View-Only) (Signed)
 Referring Physician:  Valora Agent, MD [address] [place],  [place] [address]  Primary Physician:  Valora Agent, MD  History of Present Illness: 10/31/2023 George Padilla continues to have weakness in his arms.  He had an unfortunate event where he got pinned by a bull.  He has had numbness in his left arm greater than right arm since that time.  08/01/2023 George Padilla is here today with a chief complaint of neck pain as well as numbness and tingling in his right greater than left hand.  His pain has been going on for years.  He is also having pain around his right shoulder and difficulty with range of motion with his right shoulder.  He has decreased grip and has had some trouble dropping things.  His symptoms been worsening over the past 5 years.  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  This has caused him to have some difficulty with some of his regular activities.  He does have pain into his right shoulder blade that extends down his arm into his forearm.  He also has pain on the front of his right shoulder.  Timing: constant Bowel/Bladder Dysfunction: none  Conservative measures: saw a chiropractor, acupuncture x 2-3 years, cervical traction Physical therapy: has not participated in PT  Multimodal medical therapy including regular antiinflammatories: tylenol , ibuprofen, tramadol Injections: no epidural steroid injections  Past Surgery: no spinal surgeries  George Padilla has no symptoms of cervical myelopathy.  The symptoms are causing a significant impact on the patient's life.   I have utilized the care everywhere function in epic to review the outside records available from external health systems.  Review of Systems:  A 10 point review of systems is negative, except for the pertinent positives and negatives detailed in the HPI.  Past Medical History: Past Medical History:  Diagnosis Date   GERD (gastroesophageal  reflux disease)    Hypertension    Seasonal allergies     Past Surgical History: Past Surgical History:  Procedure Laterality Date   ESOPHAGOGASTRODUODENOSCOPY N/A 11/02/2020   Procedure: ESOPHAGOGASTRODUODENOSCOPY (EGD);  Surgeon: Jinny Carmine, MD;  Location: [facility] ENDOSCOPY;  Service: Endoscopy;  Laterality: N/A;   ESOPHAGOGASTRODUODENOSCOPY (EGD) WITH PROPOFOL  N/A 11/30/2020   Procedure: ESOPHAGOGASTRODUODENOSCOPY (EGD) WITH PROPOFOL ;  Surgeon: Jinny Carmine, MD;  Location: [facility];  Service: Endoscopy;  Laterality: N/A;   FOOT SURGERY     HERNIA REPAIR     UPPER EXTREMITY ANGIOGRAPHY Right 06/08/2020   Procedure: UPPER EXTREMITY ANGIOGRAPHY;  Surgeon: Marea Selinda RAMAN, MD;  Location: [facility] INVASIVE CV LAB;  Service: Cardiovascular;  Laterality: Right;    Allergies: Allergies as of 10/31/2023 - Review Complete 10/31/2023  Allergen Reaction Noted   Amoxicillin Itching 11/22/2016   Bee venom  07/11/2015   Prednisone Swelling 05/09/2018    Medications:  Current Outpatient Medications:    escitalopram (LEXAPRO) 10 MG tablet, , Disp: , Rfl:    esomeprazole (NEXIUM) 20 MG capsule, Take by mouth., Disp: , Rfl:    fluticasone (FLONASE) 50 MCG/ACT nasal spray, Place into the nose., Disp: , Rfl:    montelukast (SINGULAIR) 10 MG tablet, Take by mouth., Disp: , Rfl:    olmesartan-hydrochlorothiazide (BENICAR HCT) 40-12.5 MG tablet, Take 1 tablet by mouth daily., Disp: , Rfl:    spironolactone (ALDACTONE) 50 MG tablet, Take 50 mg by mouth daily., Disp: , Rfl:    traMADol (ULTRAM) 50 MG tablet, Take 50  mg by mouth every 6 (six) hours as needed., Disp: , Rfl:   Social History: Social History   Tobacco Use   Smoking status: Former   Smokeless tobacco: Former  Building services engineer status: Never Used  Substance Use Topics   Alcohol use: Yes    Comment: occasionally   Drug use: No    Family Medical History: Family History  Problem Relation Age of Onset   Bladder Cancer Neg Hx     Kidney cancer Neg Hx    Prostate cancer Neg Hx     Physical Examination: Vitals:   10/31/23 1348  BP: 110/62    General: Patient is in no apparent distress. Attention to examination is appropriate.  Neck:   Supple.  Full range of motion.  Respiratory: Patient is breathing without any difficulty.   NEUROLOGICAL:     Awake, alert, oriented to person, place, and time.  Speech is clear and fluent.   Cranial Nerves: Pupils equal round and reactive to light.  Facial tone is symmetric.  Facial sensation is symmetric. Shoulder shrug is symmetric. Tongue protrusion is midline.  There is no pronator drift.  Strength: Side Biceps Triceps Deltoid Interossei Grip Wrist Ext. Wrist Flex.  R 4 4+ 4- 4 4 5 5   L 5 5 5 5 5 5 5    Side Iliopsoas Quads Hamstring PF DF EHL  R 5 5 5 5 5 5   L 5 5 5 5 5 5    Reflexes are 1+ and symmetric at the biceps, triceps, brachioradialis, patella and achilles.   Hoffman's is absent.   Bilateral upper and lower extremity sensation is intact to light touch.    No evidence of dysmetria noted.  Gait is normal.     Medical Decision Making  Imaging: MRI C spine 07/16/2023 C5-6: Disc osteophyte complex indents the ventral thecal sac with flattening of the ventral cervical cord. Thickening of the ligamentum flavum which indents the dorsal thecal sac. Mild-to-moderate spinal canal stenosis. Bilateral facet arthrosis. Uncovertebral hypertrophy more pronounced on the right. Severe right and moderate left foraminal stenosis.   C6-7: Right paracentral disc protrusion which indents the ventral thecal sac with flattening of the ventral cervical cord more pronounced on the right. Bilateral facet arthrosis and uncovertebral hypertrophy. Moderate right and mild-to-moderate left foraminal stenosis.   C7-T1: Disc osteophyte complex without significant spinal canal stenosis. Bilateral facet arthrosis. Mild foraminal stenosis on the left.    IMPRESSION: Degenerative changes of the cervical spine as above overall similar to 2021. Disc osteophyte complex and thickening of the ligamentum flavum at C5-6 resulting in mild-to-moderate spinal canal stenosis.   Central disc protrusion at C4-5 along with associated annular fissure. Mild flattening of the ventral cord at C4-5 similar to prior.   Additional right paracentral disc protrusion at C6-7 with flattening of the ventral cervical cord, similar to prior.   Multilevel foraminal stenosis, greatest and severe on the right at C5-6.     Electronically Signed   By: Donnice Mania M.D.   On: 07/25/2023 13:48  MRI CTL spine 10/07/2023 C spine No acute findings. Degenerative changes throughout most prominent at C5-6.    THIS IS AN ELECTRONICALLY VERIFIED FINAL REPORT  10/07/2023 1:42 AM - Electronically signed by Ozell Forts  Workstation: [id number]   T spine No canal stenosis or foraminal narrowing. Normal cord.   THIS IS AN ELECTRONICALLY VERIFIED FINAL REPORT  10/07/2023 1:50 AM - Electronically signed by Ozell Forts  Workstation: [id number]   L  spine  No acute findings. No significant canal or foraminal narrowing.    THIS IS AN ELECTRONICALLY VERIFIED FINAL REPORT  10/07/2023 1:53 AM - Electronically signed by Ozell Forts  Workstation: [id number]  I have personally reviewed the images and agree with the above interpretation.  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 There is no evidence of carpal tunnel syndrome or an ulnar neuropathy affecting the right upper extremity.     ___________________________ Tonita Blanch, DO  Assessment and Plan: George Padilla is a pleasant 53 y.o. male with cervical radiculopathy, and right shoulder pain.  His weakness is worsened.  At this point, I have recommended surgical intervention due to his worsening weakness.   It is possible he has a double crush injury given his more recent trauma, but I still think it is indicated to decompress his nerve roots given his ongoing weakness.  Will proceed with anterior cervical discectomy fusion C5-7.  I discussed the planned procedure at length with the patient, including the risks, benefits, alternatives, and indications. The risks discussed include but are not limited to bleeding, infection, need for reoperation, spinal fluid leak, stroke, vision loss, anesthetic complication, coma, paralysis, and even death. We also discussed the possibility of post-operative dysphagia, vocal cord paralysis, and the risk of adjacent segment disease in the future. I also described in detail that improvement was not guaranteed.  The patient expressed understanding of these risks, and asked that we proceed with surgery. I described the surgery in layman's terms, and gave ample opportunity for questions, which were answered to the best of my ability.  I spent a total of 30 minutes in this patient's care today. This time was spent reviewing pertinent records including imaging studies, obtaining and confirming history, performing a directed evaluation, formulating and discussing my recommendations, and documenting the visit within the medical record.      Thank you for involving me in the care of this patient.      George Emami K. Clois MD, [facility]

## 2023-10-31 NOTE — Addendum Note (Signed)
 Addended by: Saurabh Hettich on: 10/31/2023 03:03 PM   Modules accepted: Orders

## 2023-10-31 NOTE — Progress Notes (Signed)
 Referring Physician:  Valora Agent, MD [address] [place],  [place] [address]  Primary Physician:  Valora Agent, MD  History of Present Illness: 10/31/2023 George Padilla continues to have weakness in his arms.  He had an unfortunate event where he got pinned by a bull.  He has had numbness in his left arm greater than right arm since that time.  08/01/2023 George Padilla is here today with a chief complaint of neck pain as well as numbness and tingling in his right greater than left hand.  His pain has been going on for years.  He is also having pain around his right shoulder and difficulty with range of motion with his right shoulder.  He has decreased grip and has had some trouble dropping things.  His symptoms been worsening over the past 5 years.  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  This has caused him to have some difficulty with some of his regular activities.  He does have pain into his right shoulder blade that extends down his arm into his forearm.  He also has pain on the front of his right shoulder.  Timing: constant Bowel/Bladder Dysfunction: none  Conservative measures: saw a chiropractor, acupuncture x 2-3 years, cervical traction Physical therapy: has not participated in PT  Multimodal medical therapy including regular antiinflammatories: tylenol , ibuprofen, tramadol Injections: no epidural steroid injections  Past Surgery: no spinal surgeries  George Padilla has no symptoms of cervical myelopathy.  The symptoms are causing a significant impact on the patient's life.   I have utilized the care everywhere function in epic to review the outside records available from external health systems.  Review of Systems:  A 10 point review of systems is negative, except for the pertinent positives and negatives detailed in the HPI.  Past Medical History: Past Medical History:  Diagnosis Date   GERD (gastroesophageal  reflux disease)    Hypertension    Seasonal allergies     Past Surgical History: Past Surgical History:  Procedure Laterality Date   ESOPHAGOGASTRODUODENOSCOPY N/A 11/02/2020   Procedure: ESOPHAGOGASTRODUODENOSCOPY (EGD);  Surgeon: Jinny Carmine, MD;  Location: [facility] END[facility]COPY;  Service: Endoscopy;  Laterality: N/A;   ESOPHAGOGASTRODUODENOSCOPY (EGD) WITH PROPOFOL  N/A 11/30/2020   Procedure: ESOPHAGOGASTRODUODENOSCOPY (EGD) WITH PROPOFOL ;  Surgeon: Jinny Carmine, MD;  Location: [facility];  Service: Endoscopy;  Laterality: N/A;   FOOT SURGERY     HERNIA REPAIR     UPPER EXTREMITY ANGIOGRAPHY Right 06/08/2020   Procedure: UPPER EXTREMITY ANGIOGRAPHY;  Surgeon: Marea Selinda RAMAN, MD;  Location: [facility] INVASIVE CV LAB;  Service: Cardiovascular;  Laterality: Right;    Allergies: Allergies as of 10/31/2023 - Review Complete 10/31/2023  Allergen Reaction Noted   Amoxicillin Itching 11/22/2016   Bee venom  07/11/2015   Prednisone Swelling 05/09/2018    Medications:  Current Outpatient Medications:    escitalopram (LEXAPRO) 10 MG tablet, , Disp: , Rfl:    esomeprazole (NEXIUM) 20 MG capsule, Take by mouth., Disp: , Rfl:    fluticasone (FLONASE) 50 MCG/ACT nasal spray, Place into the nose., Disp: , Rfl:    montelukast (SINGULAIR) 10 MG tablet, Take by mouth., Disp: , Rfl:    olmesartan-hydrochlorothiazide (BENICAR HCT) 40-12.5 MG tablet, Take 1 tablet by mouth daily., Disp: , Rfl:    spironolactone (ALDACTONE) 50 MG tablet, Take 50 mg by mouth daily., Disp: , Rfl:    traMADol (ULTRAM) 50 MG tablet, Take 50  mg by mouth every 6 (six) hours as needed., Disp: , Rfl:   Social History: Social History   Tobacco Use   Smoking status: Former   Smokeless tobacco: Former  Building services engineer status: Never Used  Substance Use Topics   Alcohol use: Yes    Comment: occasionally   Drug use: No    Family Medical History: Family History  Problem Relation Age of Onset   Bladder Cancer Neg Hx     Kidney cancer Neg Hx    Prostate cancer Neg Hx     Physical Examination: Vitals:   10/31/23 1348  BP: 110/62    General: Patient is in no apparent distress. Attention to examination is appropriate.  Neck:   Supple.  Full range of motion.  Respiratory: Patient is breathing without any difficulty.   NEUROLOGICAL:     Awake, alert, oriented to person, place, and time.  Speech is clear and fluent.   Cranial Nerves: Pupils equal round and reactive to light.  Facial tone is symmetric.  Facial sensation is symmetric. Shoulder shrug is symmetric. Tongue protrusion is midline.  There is no pronator drift.  Strength: Side Biceps Triceps Deltoid Interossei Grip Wrist Ext. Wrist Flex.  R 4 4+ 4- 4 4 5 5   L 5 5 5 5 5 5 5    Side Iliopsoas Quads Hamstring PF DF EHL  R 5 5 5 5 5 5   L 5 5 5 5 5 5    Reflexes are 1+ and symmetric at the biceps, triceps, brachioradialis, patella and achilles.   Hoffman's is absent.   Bilateral upper and lower extremity sensation is intact to light touch.    No evidence of dysmetria noted.  Gait is normal.     Medical Decision Making  Imaging: MRI C spine 07/16/2023 C5-6: Disc osteophyte complex indents the ventral thecal sac with flattening of the ventral cervical cord. Thickening of the ligamentum flavum which indents the dorsal thecal sac. Mild-to-moderate spinal canal stenosis. Bilateral facet arthrosis. Uncovertebral hypertrophy more pronounced on the right. Severe right and moderate left foraminal stenosis.   C6-7: Right paracentral disc protrusion which indents the ventral thecal sac with flattening of the ventral cervical cord more pronounced on the right. Bilateral facet arthrosis and uncovertebral hypertrophy. Moderate right and mild-to-moderate left foraminal stenosis.   C7-T1: Disc osteophyte complex without significant spinal canal stenosis. Bilateral facet arthrosis. Mild foraminal stenosis on the left.    IMPRESSION: Degenerative changes of the cervical spine as above overall similar to 2021. Disc osteophyte complex and thickening of the ligamentum flavum at C5-6 resulting in mild-to-moderate spinal canal stenosis.   Central disc protrusion at C4-5 along with associated annular fissure. Mild flattening of the ventral cord at C4-5 similar to prior.   Additional right paracentral disc protrusion at C6-7 with flattening of the ventral cervical cord, similar to prior.   Multilevel foraminal stenosis, greatest and severe on the right at C5-6.     Electronically Signed   By: Donnice Mania M.D.   On: 07/25/2023 13:48  MRI CTL spine 10/07/2023 C spine No acute findings. Degenerative changes throughout most prominent at C5-6.    THIS IS AN ELECTRONICALLY VERIFIED FINAL REPORT  10/07/2023 1:42 AM - Electronically signed by Ozell Forts  Workstation: [id number]   T spine No canal stenosis or foraminal narrowing. Normal cord.   THIS IS AN ELECTRONICALLY VERIFIED FINAL REPORT  10/07/2023 1:50 AM - Electronically signed by Ozell Forts  Workstation: [id number]   L  spine  No acute findings. No significant canal or foraminal narrowing.    THIS IS AN ELECTRONICALLY VERIFIED FINAL REPORT  10/07/2023 1:53 AM - Electronically signed by Ozell Forts  Workstation: [id number]  I have personally reviewed the images and agree with the above interpretation.  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 There is no evidence of carpal tunnel syndrome or an ulnar neuropathy affecting the right upper extremity.     ___________________________ Tonita Blanch, DO  Assessment and Plan: George Padilla is a pleasant 53 y.o. male with cervical radiculopathy, and right shoulder pain.  His weakness is worsened.  At this point, I have recommended surgical intervention due to his worsening weakness.   It is possible he has a double crush injury given his more recent trauma, but I still think it is indicated to decompress his nerve roots given his ongoing weakness.  Will proceed with anterior cervical discectomy fusion C5-7.  I discussed the planned procedure at length with the patient, including the risks, benefits, alternatives, and indications. The risks discussed include but are not limited to bleeding, infection, need for reoperation, spinal fluid leak, stroke, vision loss, anesthetic complication, coma, paralysis, and even death. We also discussed the possibility of post-operative dysphagia, vocal cord paralysis, and the risk of adjacent segment disease in the future. I also described in detail that improvement was not guaranteed.  The patient expressed understanding of these risks, and asked that we proceed with surgery. I described the surgery in layman's terms, and gave ample opportunity for questions, which were answered to the best of my ability.  I spent a total of 30 minutes in this patient's care today. This time was spent reviewing pertinent records including imaging studies, obtaining and confirming history, performing a directed evaluation, formulating and discussing my recommendations, and documenting the visit within the medical record.      Thank you for involving me in the care of this patient.      Chynah Orihuela K. Clois MD, [facility] Neurosurgery

## 2023-11-06 ENCOUNTER — Inpatient Hospital Stay
Admission: RE | Admit: 2023-11-06 | Discharge: 2023-11-06 | Disposition: A | Discharge status: Home / Self Care | Payer: Self-pay | Source: Ambulatory Visit

## 2023-11-06 ENCOUNTER — Telehealth: Payer: Self-pay

## 2023-11-06 NOTE — Patient Instructions (Signed)
 Your procedure is scheduled on: Monday 11/13/23 Report to the Registration Desk on the 1st floor of the [facility]. To find out your arrival time, please call [phone] between 1PM - 3PM on: Friday 11/10/23  If your arrival time is 6:00 am, do not arrive before that time as the [facility] entrance doors do not open until 6:00 am.  REMEMBER: Instructions that are not followed completely may result in serious medical risk, up to and including death; or upon the discretion of your surgeon and anesthesiologist your surgery may need to be rescheduled.  Do not eat food after midnight the night before surgery.  No gum chewing or hard candies.  You may however, drink CLEAR liquids up to 2 hours before you are scheduled to arrive for your surgery. Do not drink anything within 2 hours of your scheduled arrival time.  Clear liquids include: - water   - apple juice without pulp - gatorade (not RED colors) - black coffee or tea (Do NOT add milk or creamers to the coffee or tea) Do NOT drink anything that is not on this list.  One week prior to surgery: Stop Anti-inflammatories (NSAIDS) such as Advil, Aleve, Ibuprofen, Motrin, Naproxen, Naprosyn and Aspirin  based products such as Excedrin, Goody's Powder, BC Powder. Stop ANY OVER THE COUNTER supplements until after surgery.  You may however, continue to take Tylenol  if needed for pain up until the day of surgery.   Continue taking all of your other prescription medications up until the day of surgery.  ON THE DAY OF SURGERY ONLY TAKE THESE MEDICATIONS WITH SIPS OF WATER :  escitalopram (LEXAPRO) 20 MG  esomeprazole (NEXIUM) 20 MG    No Alcohol for 24 hours before or after surgery.  No Smoking including e-cigarettes for 24 hours before surgery.  No chewable tobacco products for at least 6 hours before surgery.  No nicotine patches on the day of surgery.  Do not use any recreational drugs for at least a week (preferably 2 weeks)  before your surgery.  Please be advised that the combination of cocaine and anesthesia may have negative outcomes, up to and including death. If you test positive for cocaine, your surgery will be cancelled.  On the morning of surgery brush your teeth with toothpaste and water , you may rinse your mouth with mouthwash if you wish. Do not swallow any toothpaste or mouthwash.  Use CHG Soap or wipes as directed on instruction sheet.  Do not wear jewelry, make-up, hairpins, clips or nail polish.  For welded (permanent) jewelry: bracelets, anklets, waist bands, etc.  Please have this removed prior to surgery.  If it is not removed, there is a chance that hospital personnel will need to cut it off on the day of surgery.  Do not wear lotions, powders, or perfumes.   Do not shave body hair from the neck down 48 hours before surgery.  Contact lenses, hearing aids and dentures may not be worn into surgery.  Do not bring valuables to the hospital. [facility] is not responsible for any missing/lost belongings or valuables.   Notify your doctor if there is any change in your medical condition (cold, fever, infection).  Wear comfortable clothing (specific to your surgery type) to the hospital.  After surgery, you can help prevent lung complications by doing breathing exercises.  Take deep breaths and cough every 1-2 hours. Your doctor may order a device called an Incentive Spirometer to help you take deep breaths. When coughing or sneezing, hold  a pillow firmly against your incision with both hands. This is called "splinting." Doing this helps protect your incision. It also decreases belly discomfort.  If you are being admitted to the hospital overnight, leave your suitcase in the car. After surgery it may be brought to your room.  In case of increased patient census, it may be necessary for you, the patient, to continue your postoperative care in the Same Day Surgery department.  If you are being  discharged the day of surgery, you will not be allowed to drive home. You will need a responsible individual to drive you home and stay with you for 24 hours after surgery.   If you are taking public transportation, you will need to have a responsible individual with you.  Please call the Pre-admissions Testing Dept. at [phone] if you have any questions about these instructions.  Surgery Visitation Policy:  Patients having surgery or a procedure may have two visitors.  Children under the age of 55 must have an adult with them who is not the patient.  Inpatient Visitation:    Visiting hours are 7 a.m. to 8 p.m. Up to four visitors are allowed at one time in a patient room. The visitors may rotate out with other people during the day.  One visitor age 34 or older may stay with the patient overnight and must be in the room by 8 p.m.  Merchandiser, retail to address health-related social needs:  [email or web address]    Pre-operative 5 CHG Bath Instructions   You can play a key role in reducing the risk of infection after surgery. Your skin needs to be as free of germs as possible. You can reduce the number of germs on your skin by washing with CHG (chlorhexidine gluconate) soap before surgery. CHG is an antiseptic soap that kills germs and continues to kill germs even after washing.   DO NOT use if you have an allergy to chlorhexidine/CHG or antibacterial soaps. If your skin becomes reddened or irritated, stop using the CHG and notify one of our RNs at [phone].   Please shower with the CHG soap starting 4 days before surgery using the following schedule:     Please keep in mind the following:  DO NOT shave, including legs and underarms, starting the day of your first shower.   You may shave your face at any point before/day of surgery.  Place clean sheets on your bed the day you start using CHG soap. Use a clean washcloth (not used since being washed)  for each shower. DO NOT sleep with pets once you start using the CHG.   CHG Shower Instructions:  If you choose to wash your hair and private area, wash first with your normal shampoo/soap.  After you use shampoo/soap, rinse your hair and body thoroughly to remove shampoo/soap residue.  Turn the water  OFF and apply about 3 tablespoons (45 ml) of CHG soap to a CLEAN washcloth.  Apply CHG soap ONLY FROM YOUR NECK DOWN TO YOUR TOES (washing for 3-5 minutes)  DO NOT use CHG soap on face, private areas, open wounds, or sores.  Pay special attention to the area where your surgery is being performed.  If you are having back surgery, having someone wash your back for you may be helpful. Wait 2 minutes after CHG soap is applied, then you may rinse off the CHG soap.  Pat dry with a clean towel  Put on clean clothes/pajamas   If you choose to  wear lotion, please use ONLY the CHG-compatible lotions on the back of this paper.     Additional instructions for the day of surgery: DO NOT APPLY any lotions, deodorants, cologne, or perfumes.   Put on clean/comfortable clothes.  Brush your teeth.  Ask your nurse before applying any prescription medications to the skin.      CHG Compatible Lotions   Aveeno Moisturizing lotion  Cetaphil Moisturizing Cream  Cetaphil Moisturizing Lotion  Clairol Herbal Essence Moisturizing Lotion, Dry Skin  Clairol Herbal Essence Moisturizing Lotion, Extra Dry Skin  Clairol Herbal Essence Moisturizing Lotion, Normal Skin  Curel Age Defying Therapeutic Moisturizing Lotion with Alpha Hydroxy  Curel Extreme Care Body Lotion  Curel Soothing Hands Moisturizing Hand Lotion  Curel Therapeutic Moisturizing Cream, Fragrance-Free  Curel Therapeutic Moisturizing Lotion, Fragrance-Free  Curel Therapeutic Moisturizing Lotion, Original Formula  Eucerin Daily Replenishing Lotion  Eucerin Dry Skin Therapy Plus Alpha Hydroxy Crme  Eucerin Dry Skin Therapy Plus Alpha Hydroxy  Lotion  Eucerin Original Crme  Eucerin Original Lotion  Eucerin Plus Crme Eucerin Plus Lotion  Eucerin TriLipid Replenishing Lotion  Keri Anti-Bacterial Hand Lotion  Keri Deep Conditioning Original Lotion Dry Skin Formula Softly Scented  Keri Deep Conditioning Original Lotion, Fragrance Free Sensitive Skin Formula  Keri Lotion Fast Absorbing Fragrance Free Sensitive Skin Formula  Keri Lotion Fast Absorbing Softly Scented Dry Skin Formula  Keri Original Lotion  Keri Skin Renewal Lotion Keri Silky Smooth Lotion  Keri Silky Smooth Sensitive Skin Lotion  Nivea Body Creamy Conditioning Oil  Nivea Body Extra Enriched Teacher, adult education Moisturizing Lotion Nivea Crme  Nivea Skin Firming Lotion  NutraDerm 30 Skin Lotion  NutraDerm Skin Lotion  NutraDerm Therapeutic Skin Cream  NutraDerm Therapeutic Skin Lotion  ProShield Protective Hand Cream  Provon moisturizing lotion

## 2023-11-06 NOTE — Progress Notes (Signed)
 Attempted to call no answer and no VM, attempted to call son no answer.

## 2023-11-06 NOTE — Pre-Procedure Instructions (Signed)
 Attempted to call patient no answer and VM is not set up. Attempted to call son on file and no answer. Not able to leave message. Inform Kendelyn, RN at surgeon's office to inform that patient did not show for PAT appointment and not able to reach him. Will try again this week.

## 2023-11-06 NOTE — Telephone Encounter (Signed)
 Received secure chat from [place], [place] with PAT. Mr Fearnow did not show up for his PAT appointment and they have not been able to reach him.   I tried calling him twice. He did not answer and his voicemail is not set up. I will try to reach him again later.

## 2023-11-06 NOTE — Telephone Encounter (Signed)
 Called patient again. No answer, voicemail not set up.

## 2023-11-07 NOTE — Telephone Encounter (Signed)
 I spoke with Mr George Padilla. He will plan on going to his PAT appointment on Friday at 10:30am in person.

## 2023-11-10 ENCOUNTER — Encounter
Admission: RE | Admit: 2023-11-10 | Discharge: 2023-11-10 | Disposition: A | Discharge status: Home / Self Care | Payer: Self-pay | Source: Ambulatory Visit | Attending: Neurosurgery | Admitting: Neurosurgery

## 2023-11-10 VITALS — BP 110/80 | HR 71 | Resp 16 | Ht 68.0 in | Wt 198.0 lb

## 2023-11-10 DIAGNOSIS — R9431 Abnormal electrocardiogram [ECG] [EKG]: Secondary | ICD-10-CM | POA: Insufficient documentation

## 2023-11-10 DIAGNOSIS — I10 Essential (primary) hypertension: Secondary | ICD-10-CM | POA: Insufficient documentation

## 2023-11-10 DIAGNOSIS — Z01812 Encounter for preprocedural laboratory examination: Secondary | ICD-10-CM

## 2023-11-10 DIAGNOSIS — Z01818 Encounter for other preprocedural examination: Secondary | ICD-10-CM | POA: Insufficient documentation

## 2023-11-10 HISTORY — DX: Other cervical disc displacement, unspecified cervical region: M50.20

## 2023-11-10 HISTORY — DX: Aneurysm of artery of upper extremity: I72.1

## 2023-11-10 HISTORY — DX: Pain in right shoulder: M25.511

## 2023-11-10 HISTORY — DX: Radiculopathy, cervical region: M54.12

## 2023-11-10 HISTORY — DX: Pneumonia, unspecified organism: J18.9

## 2023-11-10 LAB — TYPE AND SCREEN
ABO/RH(D): O POS
Antibody Screen: NEGATIVE

## 2023-11-10 LAB — SURGICAL PCR SCREEN
MRSA, PCR: NEGATIVE
Staphylococcus aureus: POSITIVE — AB

## 2023-11-10 NOTE — Patient Instructions (Addendum)
 Your procedure is scheduled on: 11/13/23 - Monday Report to the Registration Desk on the 1st floor of the [facility]. To find out your arrival time, please call [phone] between 1PM - 3PM on: 11/10/23 - Friday If your arrival time is 6:00 am, do not arrive before that time as the [facility] entrance doors do not open until 6:00 am.  REMEMBER: Instructions that are not followed completely may result in serious medical risk, up to and including death; or upon the discretion of your surgeon and anesthesiologist your surgery may need to be rescheduled.  Do not eat food after midnight the night before surgery.  No gum chewing or hard candies.  You may however, drink CLEAR liquids up to 2 hours before you are scheduled to arrive for your surgery. Do not drink anything within 2 hours of your scheduled arrival time.  Clear liquids include: - water   - apple juice without pulp - gatorade (not RED colors) - black coffee or tea (Do NOT add milk or creamers to the coffee or tea) Do NOT drink anything that is not on this list.  You may continue these medication on up until the day of your surgery,  Anti-inflammatories (NSAIDS) such as Advil, Aleve, Ibuprofen, Motrin, Naproxen, Naprosyn and Aspirin  based products such as Excedrin, Goody's Powder, BC Powder.  You will hold all NSAIDS (Non-steroidal anti-inflammatory drugs): because you are having a fusion, please avoid taking any NSAIDS (examples: ibuprofen, motrin, aleve, naproxen, meloxicam, diclofenac) for 3 months after surgery. Celebrex is an exception and is OK to take, if prescribed. Tylenol  is not an NSAID, You may take Tylenol  if needed for pain up until the day of surgery.     HOLD - olmesartan-hydrochlorothiazide and spironolactone (ALDACTONE) on the morning of surgery.  ON THE DAY OF SURGERY ONLY TAKE THESE MEDICATIONS WITH SIPS OF WATER :  escitalopram (LEXAPRO)  esomeprazole (NEXIUM)    No Alcohol for 24 hours before or after  surgery.  No Smoking including e-cigarettes for 24 hours before surgery.  No chewable tobacco products for at least 6 hours before surgery.  No nicotine patches on the day of surgery.  Do not use any recreational drugs for at least a week (preferably 2 weeks) before your surgery.  Please be advised that the combination of cocaine and anesthesia may have negative outcomes, up to and including death. If you test positive for cocaine, your surgery will be cancelled.  On the morning of surgery brush your teeth with toothpaste and water , you may rinse your mouth with mouthwash if you wish. Do not swallow any toothpaste or mouthwash.  Use CHG Soap or wipes as directed on instruction sheet.  Do not wear jewelry, make-up, hairpins, clips or nail polish.  For welded (permanent) jewelry: bracelets, anklets, waist bands, etc.  Please have this removed prior to surgery.  If it is not removed, there is a chance that hospital personnel will need to cut it off on the day of surgery.  Do not wear lotions, powders, or perfumes.   Do not shave body hair from the neck down 48 hours before surgery.  Contact lenses, hearing aids and dentures may not be worn into surgery.  Do not bring valuables to the hospital. [facility] is not responsible for any missing/lost belongings or valuables.   Notify your doctor if there is any change in your medical condition (cold, fever, infection).  Wear comfortable clothing (specific to your surgery type) to the hospital.  Common restrictions after  spine surgery: No bending, lifting, or twisting ("BLT"). Avoid lifting objects heavier than 10 pounds for the first 6 weeks after surgery. Where possible, avoid household activities that involve lifting, bending, reaching, pushing, or pulling such as laundry, vacuuming, grocery shopping, and childcare. Try to arrange for help from friends and family for these activities while you heal. Do not drive while taking prescription  pain medication. Weeks 6 through 12 after surgery: avoid lifting more than 25 pounds.  Stop ANY OVER THE COUNTER supplements until after surgery.  After surgery, you can help prevent lung complications by doing breathing exercises.  Take deep breaths and cough every 1-2 hours. Your doctor may order a device called an Incentive Spirometer to help you take deep breaths.  When coughing or sneezing, hold a pillow firmly against your incision with both hands. This is called "splinting." Doing this helps protect your incision. It also decreases belly discomfort.  If you are being admitted to the hospital overnight, leave your suitcase in the car. After surgery it may be brought to your room.  In case of increased patient census, it may be necessary for you, the patient, to continue your postoperative care in the Same Day Surgery department.  If you are being discharged the day of surgery, you will not be allowed to drive home. You will need a responsible individual to drive you home and stay with you for 24 hours after surgery.   If you are taking public transportation, you will need to have a responsible individual with you.  Please call the Pre-admissions Testing Dept. at [phone] if you have any questions about these instructions.  Surgery Visitation Policy:  Patients having surgery or a procedure may have two visitors.  Children under the age of 20 must have an adult with them who is not the patient.  Inpatient Visitation:    Visiting hours are 7 a.m. to 8 p.m. Up to four visitors are allowed at one time in a patient room. The visitors may rotate out with other people during the day.  One visitor age 15 or older may stay with the patient overnight and must be in the room by 8 p.m.   Merchandiser, retail to address health-related social needs:  [email or web address]    Pre-operative 5 CHG Bath Instructions   You can play a key role in reducing the risk of  infection after surgery. Your skin needs to be as free of germs as possible. You can reduce the number of germs on your skin by washing with CHG (chlorhexidine gluconate) soap before surgery. CHG is an antiseptic soap that kills germs and continues to kill germs even after washing.   DO NOT use if you have an allergy to chlorhexidine/CHG or antibacterial soaps. If your skin becomes reddened or irritated, stop using the CHG and notify one of our RNs at [phone].   Please shower with the CHG soap starting 4 days before surgery using the following schedule: 08/22 - 08/25.    Please keep in mind the following:  DO NOT shave, including legs and underarms, starting the day of your first shower.   You may shave your face at any point before/day of surgery.  Place clean sheets on your bed the day you start using CHG soap. Use a clean washcloth (not used since being washed) for each shower. DO NOT sleep with pets once you start using the CHG.   CHG Shower Instructions:  If you choose to wash your hair and private  area, wash first with your normal shampoo/soap.  After you use shampoo/soap, rinse your hair and body thoroughly to remove shampoo/soap residue.  Turn the water  OFF and apply about 3 tablespoons (45 ml) of CHG soap to a CLEAN washcloth.  Apply CHG soap ONLY FROM YOUR NECK DOWN TO YOUR TOES (washing for 3-5 minutes)  DO NOT use CHG soap on face, private areas, open wounds, or sores.  Pay special attention to the area where your surgery is being performed.  If you are having back surgery, having someone wash your back for you may be helpful. Wait 2 minutes after CHG soap is applied, then you may rinse off the CHG soap.  Pat dry with a clean towel  Put on clean clothes/pajamas   If you choose to wear lotion, please use ONLY the CHG-compatible lotions on the back of this paper.     Additional instructions for the day of surgery: DO NOT APPLY any lotions, deodorants, cologne, or perfumes.    Put on clean/comfortable clothes.  Brush your teeth.  Ask your nurse before applying any prescription medications to the skin.      CHG Compatible Lotions   Aveeno Moisturizing lotion  Cetaphil Moisturizing Cream  Cetaphil Moisturizing Lotion  Clairol Herbal Essence Moisturizing Lotion, Dry Skin  Clairol Herbal Essence Moisturizing Lotion, Extra Dry Skin  Clairol Herbal Essence Moisturizing Lotion, Normal Skin  Curel Age Defying Therapeutic Moisturizing Lotion with Alpha Hydroxy  Curel Extreme Care Body Lotion  Curel Soothing Hands Moisturizing Hand Lotion  Curel Therapeutic Moisturizing Cream, Fragrance-Free  Curel Therapeutic Moisturizing Lotion, Fragrance-Free  Curel Therapeutic Moisturizing Lotion, Original Formula  Eucerin Daily Replenishing Lotion  Eucerin Dry Skin Therapy Plus Alpha Hydroxy Crme  Eucerin Dry Skin Therapy Plus Alpha Hydroxy Lotion  Eucerin Original Crme  Eucerin Original Lotion  Eucerin Plus Crme Eucerin Plus Lotion  Eucerin TriLipid Replenishing Lotion  Keri Anti-Bacterial Hand Lotion  Keri Deep Conditioning Original Lotion Dry Skin Formula Softly Scented  Keri Deep Conditioning Original Lotion, Fragrance Free Sensitive Skin Formula  Keri Lotion Fast Absorbing Fragrance Free Sensitive Skin Formula  Keri Lotion Fast Absorbing Softly Scented Dry Skin Formula  Keri Original Lotion  Keri Skin Renewal Lotion Keri Silky Smooth Lotion  Keri Silky Smooth Sensitive Skin Lotion  Nivea Body Creamy Conditioning Oil  Nivea Body Extra Enriched Lotion  Nivea Body Original Lotion  Nivea Body Sheer Moisturizing Lotion Nivea Crme  Nivea Skin Firming Lotion  NutraDerm 30 Skin Lotion  NutraDerm Skin Lotion  NutraDerm Therapeutic Skin Cream  NutraDerm Therapeutic Skin Lotion  ProShield Protective Hand Cream  Provon moisturizing lotion  How to Use an Incentive Spirometer  An incentive spirometer is a tool that measures how well you are filling your lungs  with each breath. Learning to take long, deep breaths using this tool can help you keep your lungs clear and active. This may help to reverse or lessen your chance of developing breathing (pulmonary) problems, especially infection. You may be asked to use a spirometer: After a surgery. If you have a lung problem or a history of smoking. After a long period of time when you have been unable to move or be active. If the spirometer includes an indicator to show the highest number that you have reached, your health care provider or respiratory therapist will help you set a goal. Keep a log of your progress as told by your health care provider. What are the risks? Breathing too quickly may cause dizziness or cause  you to pass out. Take your time so you do not get dizzy or light-headed. If you are in pain, you may need to take pain medicine before doing incentive spirometry. It is harder to take a deep breath if you are having pain. How to use your incentive spirometer  Sit up on the edge of your bed or on a chair. Hold the incentive spirometer so that it is in an upright position. Before you use the spirometer, breathe out normally. Place the mouthpiece in your mouth. Make sure your lips are closed tightly around it. Breathe in slowly and as deeply as you can through your mouth, causing the piston or the ball to rise toward the top of the chamber. Hold your breath for 3-5 seconds, or for as long as possible. If the spirometer includes a coach indicator, use this to guide you in breathing. Slow down your breathing if the indicator goes above the marked areas. Remove the mouthpiece from your mouth and breathe out normally. The piston or ball will return to the bottom of the chamber. Rest for a few seconds, then repeat the steps 10 or more times. Take your time and take a few normal breaths between deep breaths so that you do not get dizzy or light-headed. Do this every 1-2 hours when you are awake. If  the spirometer includes a goal marker to show the highest number you have reached (best effort), use this as a goal to work toward during each repetition. After each set of 10 deep breaths, cough a few times. This will help to make sure that your lungs are clear. If you have an incision on your chest or abdomen from surgery, place a pillow or a rolled-up towel firmly against the incision when you cough. This can help to reduce pain while taking deep breaths and coughing. General tips When you are able to get out of bed: Walk around often. Continue to take deep breaths and cough in order to clear your lungs. Keep using the incentive spirometer until your health care provider says it is okay to stop using it. If you have been in the hospital, you may be told to keep using the spirometer at home. Contact a health care provider if: You are having difficulty using the spirometer. You have trouble using the spirometer as often as instructed. Your pain medicine is not giving enough relief for you to use the spirometer as told. You have a fever. Get help right away if: You develop shortness of breath. You develop a cough with bloody mucus from the lungs. You have fluid or blood coming from an incision site after you cough. Summary An incentive spirometer is a tool that can help you learn to take long, deep breaths to keep your lungs clear and active. You may be asked to use a spirometer after a surgery, if you have a lung problem or a history of smoking, or if you have been inactive for a long period of time. Use your incentive spirometer as instructed every 1-2 hours while you are awake. If you have an incision on your chest or abdomen, place a pillow or a rolled-up towel firmly against your incision when you cough. This will help to reduce pain. Get help right away if you have shortness of breath, you cough up bloody mucus, or blood comes from your incision when you cough. This information is not  intended to replace advice given to you by your health care provider. Make sure you  discuss any questions you have with your health care provider. Document Revised: 05/27/2019 Document Reviewed: 05/27/2019 Elsevier Patient Education  2023 [employer].

## 2023-11-12 MED ORDER — BUPIVACAINE-EPINEPHRINE (PF) 0.25% -1:200000 IJ SOLN
INTRAMUSCULAR | Status: AC
  Filled 2023-11-12: qty 30

## 2023-11-13 ENCOUNTER — Other Ambulatory Visit: Payer: Self-pay

## 2023-11-13 ENCOUNTER — Encounter: Payer: Self-pay | Admitting: Neurosurgery

## 2023-11-13 ENCOUNTER — Telehealth: Payer: Self-pay

## 2023-11-13 ENCOUNTER — Ambulatory Visit: Payer: Self-pay | Admitting: Urgent Care

## 2023-11-13 ENCOUNTER — Ambulatory Visit: Payer: Self-pay

## 2023-11-13 ENCOUNTER — Encounter
Admission: RE | Disposition: A | Discharge status: Home / Self Care | Payer: Self-pay | Source: Home / Self Care | Attending: Neurosurgery

## 2023-11-13 ENCOUNTER — Ambulatory Visit: Payer: Self-pay | Admitting: Registered Nurse

## 2023-11-13 ENCOUNTER — Ambulatory Visit
Admission: RE | Admit: 2023-11-13 | Discharge: 2023-11-13 | Disposition: A | Discharge status: Home / Self Care | Payer: Self-pay | Attending: Neurosurgery | Admitting: Neurosurgery

## 2023-11-13 DIAGNOSIS — R29898 Other symptoms and signs involving the musculoskeletal system: Secondary | ICD-10-CM

## 2023-11-13 DIAGNOSIS — K219 Gastro-esophageal reflux disease without esophagitis: Secondary | ICD-10-CM | POA: Insufficient documentation

## 2023-11-13 DIAGNOSIS — M5412 Radiculopathy, cervical region: Secondary | ICD-10-CM | POA: Insufficient documentation

## 2023-11-13 DIAGNOSIS — M4802 Spinal stenosis, cervical region: Secondary | ICD-10-CM | POA: Insufficient documentation

## 2023-11-13 DIAGNOSIS — Z01818 Encounter for other preprocedural examination: Secondary | ICD-10-CM

## 2023-11-13 DIAGNOSIS — M2578 Osteophyte, vertebrae: Secondary | ICD-10-CM | POA: Insufficient documentation

## 2023-11-13 DIAGNOSIS — I10 Essential (primary) hypertension: Secondary | ICD-10-CM | POA: Insufficient documentation

## 2023-11-13 DIAGNOSIS — Z87891 Personal history of nicotine dependence: Secondary | ICD-10-CM | POA: Insufficient documentation

## 2023-11-13 HISTORY — PX: ANTERIOR CERVICAL DECOMP/DISCECTOMY FUSION: SHX1161

## 2023-11-13 SURGERY — ANTERIOR CERVICAL DECOMPRESSION/DISCECTOMY FUSION 2 LEVELS
Anesthesia: General | Site: Spine Cervical

## 2023-11-13 MED ORDER — CEFAZOLIN SODIUM-DEXTROSE 2-4 GM/100ML-% IV SOLN
INTRAVENOUS | Status: AC
  Filled 2023-11-13: qty 100

## 2023-11-13 MED ORDER — HYDROMORPHONE HCL 1 MG/ML IJ SOLN
INTRAMUSCULAR | Status: AC
  Filled 2023-11-13: qty 1

## 2023-11-13 MED ORDER — SENNA 8.6 MG PO TABS
1.0000 | ORAL_TABLET | Freq: Two times a day (BID) | ORAL | 0 refills | Status: DC | PRN
Start: 2023-11-13 — End: 2024-02-08
  Filled 2023-11-13: qty 30, 15d supply, fill #0

## 2023-11-13 MED ORDER — BUPIVACAINE-EPINEPHRINE (PF) 0.5% -1:200000 IJ SOLN
INTRAMUSCULAR | Status: DC | PRN
  Administered 2023-11-13: 10 mL

## 2023-11-13 MED ORDER — FENTANYL CITRATE (PF) 100 MCG/2ML IJ SOLN
INTRAMUSCULAR | Status: DC | PRN
  Administered 2023-11-13: 100 ug via INTRAVENOUS

## 2023-11-13 MED ORDER — SURGIFLO WITH THROMBIN (HEMOSTATIC MATRIX KIT) OPTIME
TOPICAL | Status: DC | PRN
  Administered 2023-11-13: 1 via TOPICAL

## 2023-11-13 MED ORDER — ONDANSETRON HCL 4 MG/2ML IJ SOLN
INTRAMUSCULAR | Status: DC | PRN
  Administered 2023-11-13: 4 mg via INTRAVENOUS

## 2023-11-13 MED ORDER — LACTATED RINGERS IV SOLN: INTRAVENOUS | Status: DC

## 2023-11-13 MED ORDER — OXYCODONE HCL 5 MG PO TABS
5.0000 mg | ORAL_TABLET | Freq: Once | ORAL | Status: AC | PRN
  Administered 2023-11-13: 5 mg via ORAL

## 2023-11-13 MED ORDER — PHENYLEPHRINE 80 MCG/ML (10ML) SYRINGE FOR IV PUSH (FOR BLOOD PRESSURE SUPPORT)
PREFILLED_SYRINGE | INTRAVENOUS | Status: AC
  Filled 2023-11-13: qty 10

## 2023-11-13 MED ORDER — METHOCARBAMOL 500 MG PO TABS
ORAL_TABLET | ORAL | Status: AC
  Filled 2023-11-13: qty 1

## 2023-11-13 MED ORDER — PROPOFOL 10 MG/ML IV BOLUS
INTRAVENOUS | Status: DC | PRN
  Administered 2023-11-13: 150 mg via INTRAVENOUS

## 2023-11-13 MED ORDER — ROCURONIUM BROMIDE 100 MG/10ML IV SOLN
INTRAVENOUS | Status: DC | PRN
  Administered 2023-11-13: 30 mg via INTRAVENOUS

## 2023-11-13 MED ORDER — HYDROMORPHONE HCL 1 MG/ML IJ SOLN
INTRAMUSCULAR | Status: AC
Start: 2023-11-13 — End: 2023-11-13
  Filled 2023-11-13: qty 1

## 2023-11-13 MED ORDER — OXYCODONE HCL 5 MG PO TABS
5.0000 mg | ORAL_TABLET | ORAL | 0 refills | Status: AC | PRN
  Filled 2023-11-13: qty 30, 5d supply, fill #0

## 2023-11-13 MED ORDER — 0.9 % SODIUM CHLORIDE (POUR BTL) OPTIME
TOPICAL | Status: DC | PRN
  Administered 2023-11-13: 500 mL

## 2023-11-13 MED ORDER — PHENYLEPHRINE HCL-NACL 20-0.9 MG/250ML-% IV SOLN
INTRAVENOUS | Status: AC
  Filled 2023-11-13: qty 250

## 2023-11-13 MED ORDER — CHLORHEXIDINE GLUCONATE 0.12 % MT SOLN
15.0000 mL | Freq: Once | OROMUCOSAL | Status: AC
  Administered 2023-11-13: 15 mL via OROMUCOSAL

## 2023-11-13 MED ORDER — PROPOFOL 10 MG/ML IV BOLUS
INTRAVENOUS | Status: AC
  Filled 2023-11-13: qty 20

## 2023-11-13 MED ORDER — OXYCODONE HCL 5 MG PO TABS
ORAL_TABLET | ORAL | Status: AC
  Filled 2023-11-13: qty 1

## 2023-11-13 MED ORDER — METHOCARBAMOL 500 MG PO TABS
500.0000 mg | ORAL_TABLET | Freq: Four times a day (QID) | ORAL | 0 refills | Status: DC | PRN
Start: 2023-11-13 — End: 2024-02-08
  Filled 2023-11-13: qty 100, 25d supply, fill #0

## 2023-11-13 MED ORDER — REMIFENTANIL HCL 1 MG IV SOLR
INTRAVENOUS | Status: AC
  Filled 2023-11-13: qty 1000

## 2023-11-13 MED ORDER — EPHEDRINE SULFATE-NACL 50-0.9 MG/10ML-% IV SOSY
PREFILLED_SYRINGE | INTRAVENOUS | Status: DC | PRN
  Administered 2023-11-13 (×3): 5 mg via INTRAVENOUS

## 2023-11-13 MED ORDER — ONDANSETRON HCL 4 MG/2ML IJ SOLN
INTRAMUSCULAR | Status: AC
  Filled 2023-11-13: qty 2

## 2023-11-13 MED ORDER — ORAL CARE MOUTH RINSE: 15.0000 mL | Freq: Once | OROMUCOSAL | Status: AC

## 2023-11-13 MED ORDER — REMIFENTANIL HCL 1 MG IV SOLR
INTRAVENOUS | Status: DC | PRN
  Administered 2023-11-13 (×2): .05 ug/kg/min via INTRAVENOUS

## 2023-11-13 MED ORDER — FENTANYL CITRATE (PF) 100 MCG/2ML IJ SOLN
INTRAMUSCULAR | Status: AC
  Filled 2023-11-13: qty 2

## 2023-11-13 MED ORDER — CHLORHEXIDINE GLUCONATE 0.12 % MT SOLN
OROMUCOSAL | Status: AC
Start: 2023-11-13 — End: 2023-11-13
  Filled 2023-11-13: qty 15

## 2023-11-13 MED ORDER — ROCURONIUM BROMIDE 10 MG/ML (PF) SYRINGE
PREFILLED_SYRINGE | INTRAVENOUS | Status: AC
  Filled 2023-11-13: qty 10

## 2023-11-13 MED ORDER — METHOCARBAMOL 500 MG PO TABS
500.0000 mg | ORAL_TABLET | Freq: Four times a day (QID) | ORAL | Status: DC | PRN
  Administered 2023-11-13: 500 mg via ORAL

## 2023-11-13 MED ORDER — MIDAZOLAM HCL 2 MG/2ML IJ SOLN
INTRAMUSCULAR | Status: AC
  Filled 2023-11-13: qty 2

## 2023-11-13 MED ORDER — MUPIROCIN 2 % EX OINT
TOPICAL_OINTMENT | CUTANEOUS | 0 refills | Status: DC
  Filled 2023-11-13: qty 22, 10d supply, fill #0

## 2023-11-13 MED ORDER — CEFAZOLIN SODIUM-DEXTROSE 2-4 GM/100ML-% IV SOLN
2.0000 g | Freq: Once | INTRAVENOUS | Status: AC
  Administered 2023-11-13: 2 g via INTRAVENOUS

## 2023-11-13 MED ORDER — BUPIVACAINE-EPINEPHRINE (PF) 0.5% -1:200000 IJ SOLN
INTRAMUSCULAR | Status: AC
  Filled 2023-11-13: qty 10

## 2023-11-13 MED ORDER — PHENYLEPHRINE 80 MCG/ML (10ML) SYRINGE FOR IV PUSH (FOR BLOOD PRESSURE SUPPORT)
PREFILLED_SYRINGE | INTRAVENOUS | Status: DC | PRN
  Administered 2023-11-13 (×3): 80 ug via INTRAVENOUS

## 2023-11-13 MED ORDER — DEXAMETHASONE SODIUM PHOSPHATE 10 MG/ML IJ SOLN
INTRAMUSCULAR | Status: AC
Start: 2023-11-13 — End: 2023-11-13
  Filled 2023-11-13: qty 1

## 2023-11-13 MED ORDER — MIDAZOLAM HCL 2 MG/2ML IJ SOLN
INTRAMUSCULAR | Status: DC | PRN
  Administered 2023-11-13: 2 mg via INTRAVENOUS

## 2023-11-13 MED ORDER — PHENYLEPHRINE HCL-NACL 20-0.9 MG/250ML-% IV SOLN
INTRAVENOUS | Status: DC | PRN
  Administered 2023-11-13: 30 ug/min via INTRAVENOUS

## 2023-11-13 MED ORDER — OXYCODONE HCL 5 MG/5ML PO SOLN: 5.0000 mg | Freq: Once | ORAL | Status: AC | PRN

## 2023-11-13 MED ORDER — DEXAMETHASONE SODIUM PHOSPHATE 10 MG/ML IJ SOLN
INTRAMUSCULAR | Status: DC | PRN
  Administered 2023-11-13: 8 mg via INTRAVENOUS

## 2023-11-13 MED ORDER — SUCCINYLCHOLINE CHLORIDE 200 MG/10ML IV SOSY
PREFILLED_SYRINGE | INTRAVENOUS | Status: DC | PRN
  Administered 2023-11-13: 100 mg via INTRAVENOUS

## 2023-11-13 MED ORDER — ALBUTEROL SULFATE HFA 108 (90 BASE) MCG/ACT IN AERS
INHALATION_SPRAY | RESPIRATORY_TRACT | Status: DC | PRN
Start: 2023-11-13 — End: 2023-11-13
  Administered 2023-11-13 (×2): 4 via RESPIRATORY_TRACT

## 2023-11-13 MED ORDER — LIDOCAINE HCL (PF) 2 % IJ SOLN
INTRAMUSCULAR | Status: AC
  Filled 2023-11-13: qty 5

## 2023-11-13 MED ORDER — HYDROMORPHONE HCL 1 MG/ML IJ SOLN
0.2500 mg | INTRAMUSCULAR | Status: DC | PRN
  Administered 2023-11-13 (×4): 0.5 mg via INTRAVENOUS

## 2023-11-13 MED ORDER — LIDOCAINE HCL (CARDIAC) PF 100 MG/5ML IV SOSY
PREFILLED_SYRINGE | INTRAVENOUS | Status: DC | PRN
  Administered 2023-11-13: 80 mg via INTRAVENOUS

## 2023-11-13 MED ORDER — SUCCINYLCHOLINE CHLORIDE 200 MG/10ML IV SOSY
PREFILLED_SYRINGE | INTRAVENOUS | Status: AC
  Filled 2023-11-13: qty 10

## 2023-11-13 SURGICAL SUPPLY — 38 items
ALLOGRAFT BONE FIBER KORE 1CC (Bone Implant) IMPLANT
BASIN KIT SINGLE STR (MISCELLANEOUS) ×1 IMPLANT
BUR NEURO DRILL SOFT 3.0X3.8M (BURR) ×1 IMPLANT
DERMABOND ADVANCED .7 DNX12 (GAUZE/BANDAGES/DRESSINGS) ×1 IMPLANT
DRAIN CHANNEL JP 10F RND 20C F (MISCELLANEOUS) IMPLANT
DRAPE C ARM PK CFD 31 SPINE (DRAPES) ×1 IMPLANT
DRAPE LAPAROTOMY 77X122 PED (DRAPES) ×1 IMPLANT
DRAPE MICROSCOPE SPINE 48X150 (DRAPES) ×1 IMPLANT
DRSG TEGADERM 4X4.75 (GAUZE/BANDAGES/DRESSINGS) IMPLANT
ELECTRODE REM PT RTRN 9FT ADLT (ELECTROSURGICAL) ×1 IMPLANT
EVACUATOR SILICONE 100CC (DRAIN) IMPLANT
FEE INTRAOP CADWELL SUPPLY NCS (MISCELLANEOUS) IMPLANT
FEE INTRAOP MONITOR IMPULS NCS (MISCELLANEOUS) IMPLANT
GAUZE SPONGE 2X2 STRL 8-PLY (GAUZE/BANDAGES/DRESSINGS) IMPLANT
GLOVE BIOGEL PI IND STRL 6.5 (GLOVE) ×1 IMPLANT
GLOVE SURG SYN 6.5 PF PI (GLOVE) ×1 IMPLANT
GLOVE SURG SYN 8.5 PF PI (GLOVE) ×3 IMPLANT
GOWN SRG LRG LVL 4 IMPRV REINF (GOWNS) ×1 IMPLANT
GOWN SRG XL LVL 3 NONREINFORCE (GOWNS) ×1 IMPLANT
KIT TURNOVER KIT A (KITS) ×1 IMPLANT
MANIFOLD NEPTUNE II (INSTRUMENTS) ×1 IMPLANT
NS IRRIG 500ML POUR BTL (IV SOLUTION) ×1 IMPLANT
PACK LAMINECTOMY ARMC (PACKS) ×1 IMPLANT
PAD ARMBOARD POSITIONER FOAM (MISCELLANEOUS) ×2 IMPLANT
PIN CASPAR 14 (PIN) ×1 IMPLANT
PLATE ACP 1.6X38 2LVL (Plate) IMPLANT
SCREW ACP 3.5X17 S/D VARIA (Screw) IMPLANT
SPACER C HEDRON 12X14 7M 7D (Spacer) IMPLANT
SPACER HEDRON C 12X14X8 7D (Spacer) IMPLANT
SPONGE KITTNER 5P (MISCELLANEOUS) ×1 IMPLANT
STAPLER SKIN PROX 35W (STAPLE) IMPLANT
SURGIFLO W/THROMBIN 8M KIT (HEMOSTASIS) ×1 IMPLANT
SUT STRATA 3-0 15 PS-2 (SUTURE) ×1 IMPLANT
SUT VIC AB 3-0 SH 8-18 (SUTURE) ×1 IMPLANT
SUTURE EHLN 3-0 FS-10 30 BLK (SUTURE) IMPLANT
SYR 20ML LL LF (SYRINGE) ×1 IMPLANT
TAPE CLOTH 3X10 WHT NS LF (GAUZE/BANDAGES/DRESSINGS) ×2 IMPLANT
TRAP FLUID SMOKE EVACUATOR (MISCELLANEOUS) ×1 IMPLANT

## 2023-11-13 NOTE — Discharge Instructions (Signed)

## 2023-11-13 NOTE — Interval H&P Note (Signed)
 History and Physical Interval Note:  11/13/2023 7:06 AM  George Padilla  has presented today for surgery, with the diagnosis of M54.12 Cervical radiculopathy R29.898 Right arm weakness.  The various methods of treatment have been discussed with the patient and family. After consideration of risks, benefits and other options for treatment, the patient has consented to  Procedure(s) with comments: ANTERIOR CERVICAL DECOMPRESSION/DISCECTOMY FUSION 2 LEVELS (N/A) - C5-7 ANTERIOR CERVICAL DISCECTOMY AND FUSION as a surgical intervention.  The patient's history has been reviewed, patient examined, no change in status, stable for surgery.  I have reviewed the patient's chart and labs.  Questions were answered to the patient's satisfaction.    Heart sounds normal no MRG. Chest Clear to Auscultation Bilaterally.  Teirra Carapia

## 2023-11-13 NOTE — Progress Notes (Signed)
 Patient able to void, ambulate and tolerate po intake without difficulty.

## 2023-11-13 NOTE — Anesthesia Postprocedure Evaluation (Signed)
 Anesthesia Post Note  Patient: George Padilla  Procedure(s) Performed: ANTERIOR CERVICAL DECOMPRESSION/DISCECTOMY FUSION 2 LEVELS (Spine Cervical)  Patient location during evaluation: PACU Anesthesia Type: General Level of consciousness: awake and alert Pain management: pain level controlled Vital Signs Assessment: post-procedure vital signs reviewed and stable Respiratory status: spontaneous breathing, nonlabored ventilation, respiratory function stable and patient connected to nasal cannula oxygen Cardiovascular status: blood pressure returned to baseline and stable Postop Assessment: no apparent nausea or vomiting Anesthetic complications: no   No notable events documented.   Last Vitals:  Vitals:   11/13/23 1031 11/13/23 1045  BP: (!) 144/86 (!) 142/87  Pulse: 84 89  Resp: 14 15  Temp:    SpO2: 91% 93%    Last Pain:  Vitals:   11/13/23 1045  TempSrc:   PainSc: 5                  Lendia LITTIE Mae

## 2023-11-13 NOTE — Anesthesia Procedure Notes (Signed)
 Procedure Name: Intubation Date/Time: 11/13/2023 7:29 AM  Performed by: Elly Pfeiffer, CRNAPre-anesthesia Checklist: Patient identified, Emergency Drugs available, Suction available and Patient being monitored Patient Re-evaluated:Patient Re-evaluated prior to induction Oxygen Delivery Method: Circle system utilized Preoxygenation: Pre-oxygenation with 100% oxygen Induction Type: IV induction Ventilation: Mask ventilation without difficulty Laryngoscope Size: McGrath and 4 Grade View: Grade I Tube type: Oral Tube size: 7.5 mm Number of attempts: 1 Airway Equipment and Method: Stylet and Oral airway Placement Confirmation: ETT inserted through vocal cords under direct vision, positive ETCO2 and breath sounds checked- equal and bilateral Secured at: 23 cm Tube secured with: Tape Dental Injury: Teeth and Oropharynx as per pre-operative assessment  Comments: Cords clear; no trauma. CA

## 2023-11-13 NOTE — Discharge Summary (Signed)
 Discharge Summary  Patient ID: George Padilla MRN: [id number] DOB/AGE: 05/17/1970 53 y.o.  Admit date: 11/13/2023 Discharge date: 11/13/2023  Admission Diagnoses: M54.12 Cervical radiculopathy, R29.898 Right arm weakness  Discharge Diagnoses:  Active Problems:   Right arm weakness   Discharged Condition: good  Hospital Course:  George Padilla is a 53 y.o presenting with cervical radiculopathy and right arm weakness s/p C5-7 ACDF. His intraoperative was uncomplicated. He was monitored in PACU and discharged home after ambulating, urinating, and tolerating p.o. intake.  He was given prescriptions for pain medication to take as needed.  Consults: None  Significant Diagnostic Studies: NA  Treatments: surgery: As above.  Please see separately dictated operative report for further details.  Discharge Exam: Blood pressure (!) 142/87, pulse 89, temperature (!) 97.3 F (36.3 C), resp. rate 15, height 5' 8 (1.727 m), weight 89.8 kg, SpO2 93%. CN grossly intact MAEW Incision c/d/I with Dermabond in place  Disposition: Discharge disposition: 01-Home or Self Care        Allergies as of 11/13/2023       Reactions   Amoxicillin Itching   Bee Venom    Prednisone Swelling        Medication List     STOP taking these medications    ibuprofen 200 MG tablet Commonly known as: ADVIL       TAKE these medications    escitalopram 20 MG tablet Commonly known as: LEXAPRO Take 20 mg by mouth daily.   esomeprazole 20 MG capsule Commonly known as: NEXIUM Take 20 mg by mouth daily.   methocarbamol  500 MG tablet Commonly known as: ROBAXIN  Take 1 tablet (500 mg total) by mouth every 6 (six) hours as needed for muscle spasms.   montelukast 10 MG tablet Commonly known as: SINGULAIR Take 10 mg by mouth at bedtime.   multivitamin tablet Take 1 tablet by mouth daily.   olmesartan-hydrochlorothiazide 40-12.5 MG tablet Commonly known as: BENICAR HCT Take 1 tablet by mouth  daily.   oxyCODONE  5 MG immediate release tablet Commonly known as: Roxicodone  Take 1 tablet (5 mg total) by mouth every 4 (four) hours as needed for up to 5 days.   senna 8.6 MG Tabs tablet Commonly known as: SENOKOT Take 1 tablet (8.6 mg total) by mouth 2 (two) times daily as needed.   spironolactone 50 MG tablet Commonly known as: ALDACTONE Take 50 mg by mouth daily.         Signed: Edsel Jama Goods 11/13/2023, 10:51 AM

## 2023-11-13 NOTE — Transfer of Care (Signed)
 Immediate Anesthesia Transfer of Care Note  Patient: George Padilla  Procedure(s) Performed: ANTERIOR CERVICAL DECOMPRESSION/DISCECTOMY FUSION 2 LEVELS (Spine Cervical)  Patient Location: PACU  Anesthesia Type:General  Level of Consciousness: drowsy and patient cooperative  Airway & Oxygen Therapy: Patient Spontanous Breathing and Patient connected to face mask oxygen  Post-op Assessment: Report given to RN and Post -op Vital signs reviewed and stable  Post vital signs: stable  Last Vitals:  Vitals Value Taken Time  BP 149/82 11/13/23 09:35  Temp    Pulse 98 11/13/23 09:39  Resp 16 11/13/23 09:39  SpO2 98 % 11/13/23 09:39  Vitals shown include unfiled device data.  Last Pain:  Vitals:   11/13/23 0615  TempSrc: Temporal  PainSc: 0-No pain         Complications: No notable events documented.

## 2023-11-13 NOTE — Telephone Encounter (Signed)
    Rx for mupirocin  sent in per messages above. George Padilla states she went over the protocol with the patient.

## 2023-11-13 NOTE — Anesthesia Preprocedure Evaluation (Signed)
 Anesthesia Evaluation  Patient identified by MRN, date of birth, ID band Patient awake    Reviewed: Allergy & Precautions, NPO status , Patient's Chart, lab work & pertinent test results  History of Anesthesia Complications Negative for: history of anesthetic complications  Airway Mallampati: II  TM Distance: >3 FB Neck ROM: full    Dental no notable dental hx.    Pulmonary neg pulmonary ROS, former smoker   Pulmonary exam normal        Cardiovascular hypertension, On Medications negative cardio ROS Normal cardiovascular exam     Neuro/Psych  Neuromuscular disease (numbness in L arm this morning)  negative psych ROS   GI/Hepatic Neg liver ROS,GERD  Medicated,,  Endo/Other  negative endocrine ROS    Renal/GU      Musculoskeletal   Abdominal   Peds  Hematology negative hematology ROS (+)   Anesthesia Other Findings Past Medical History: No date: Cervical radiculopathy 2025: Cervical stenosis of spinal canal     Comment:  Multilevel foraminal stenosis, greatest and severe on               the right at No date: GERD (gastroesophageal reflux disease) No date: Hypertension No date: Pneumonia No date: Protrusion of cervical intervertebral disc     Comment:  Central disc protrusion at C4-5 No date: Seasonal allergies No date: Shoulder pain, right 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[facility])  Past Surgical History: 2024: COLONOSCOPY W/ POLYPECTOMY 11/02/2020: ESOPHAGOGASTRODUODENOSCOPY; N/A     Comment:  Procedure: ESOPHAGOGASTRODUODENOSCOPY (EGD);  Surgeon:               Jinny Carmine, MD;  Location: [facility] ENDOSCOPY;  Service:               Endoscopy;  Laterality: N/A; 11/30/2020: ESOPHAGOGASTRODUODENOSCOPY (EGD) WITH PROPOFOL ; N/A     Comment:  Procedure: ESOPHAGOGASTRODUODENOSCOPY (EGD) WITH               PROPOFOL ;  Surgeon: Jinny Carmine, MD;  Location: [facility]               SURGERY [facility];  Service: Endoscopy;   Laterality: N/A; No date: FOOT SURGERY; Left No date: HERNIA REPAIR; Bilateral     Comment:  inguinal bilat No date: ulnar artery exploration with endarterectomy, resection of  redundancy, distal artery angioplasty, and primary end-to-end  anastomosis 09/23/20. 06/08/2020: UPPER EXTREMITY ANGIOGRAPHY; Right     Comment:  Procedure: UPPER EXTREMITY ANGIOGRAPHY;  Surgeon: Marea Selinda RAMAN, MD;  Location: [facility] INVASIVE CV LAB;  Service:               Cardiovascular;  Laterality: Right;  BMI    Body Mass Index: 30.10 kg/m      Reproductive/Obstetrics negative OB ROS                              Anesthesia Physical Anesthesia Plan  ASA: 2  Anesthesia Plan: General ETT   Post-op Pain Management: Ofirmev  IV (intra-op)*, Toradol IV (intra-op)*, Dilaudid  IV, Ketamine IV* and Precedex   Induction: Intravenous  PONV Risk Score and Plan: 2 and Ondansetron , Dexamethasone , Midazolam  and Treatment may vary due to age or medical condition  Airway Management Planned: Oral ETT  Additional Equipment:   Intra-op Plan:   Post-operative Plan: Extubation in OR  Informed Consent: I have reviewed the patients History and Physical, chart, labs  and discussed the procedure including the risks, benefits and alternatives for the proposed anesthesia with the patient or authorized representative who has indicated his/her understanding and acceptance.     Dental Advisory Given  Plan Discussed with: Anesthesiologist, CRNA and Surgeon  Anesthesia Plan Comments: (Patient consented for risks of anesthesia including but not limited to:  - adverse reactions to medications - damage to eyes, teeth, lips or other oral mucosa - nerve damage due to positioning  - sore throat or hoarseness - Damage to heart, brain, nerves, lungs, other parts of body or loss of life  Patient voiced understanding and assent.)         Anesthesia Quick Evaluation

## 2023-11-13 NOTE — Op Note (Signed)
 Indications: [name] is a 53 y.o. male with M54.12 Cervical radiculopathy, R29.898 Right arm weakness . Due to ongoing symptoms and lack of reasonable conservative management options, the patient opted for surgical intervention.  Findings: stenosis, successful decompression  Preoperative Diagnosis: M54.12 Cervical radiculopathy, R29.898 Right arm weakness  Postoperative Diagnosis: same   EBL: 25 ml IVF: see anesthesia record Drains: none Disposition: Extubated and Stable to PACU Complications: none  No foley catheter was placed.  Preoperative Note:   Risks of surgery discussed and documented in clinic note.  Operative Note:   Procedure:  1) Anterior cervical diskectomy and fusion at C5/6 and C6/7 2) Anterior cervical instrumentation at C5 - 7 3) Placement of biomechanical devices at C5/6 and C6/7  4) Use of operative microscope 5) Use of flouroscopy   Procedure: After obtaining informed consent, the patient taken to the operating room, placed in supine position, general anesthesia induced.  The patient had a small shoulder roll placed behind their shoulders.  The patient received preop antibiotics and IV Decadron .  A timeout was performed.  The patient had a neck incision outlined, was prepped and draped in usual sterile fashion. The incision was injected with local anesthetic.   An incision was opened, dissection taken down medial to the carotid artery and jugular vein, lateral to the trachea and esophagus.  The prevertebral fascia identified and a localizing x-ray demonstrated the correct level.  The longus colli were dissected laterally, and self-retaining retractors placed to open the operative field. The microscope was then brought into the field.  With this complete, distractor pins were placed in the vertebral bodies of C5 and C7. The distractor was placed, and the annuli at C5/6 and C6/7 were opened using a bovie.  Curettes and pituitary rongeurs used to remove the  majority of C5/6 disk, then the drill was used to remove the posterior osteophyte and begin the foraminotomies. The nerve hook was used to elevate the posterior longitudinal ligament, which was then removed with Kerrison rongeurs to complete decompression of the spinal cord. The Kerrison rongeurs were then used to complete the foraminotomies bilaterally to decompress the nerve roots. The nerve hook could be passed out each foramen, ensuring decompression of the nerve roots. Meticulous hemostasis was obtained. A biomechanical device (Globus Hedron C 8 mm height x 14 mm width by 12 mm depth) was placed at C5/6. The device had been filled with demineralized bone matrix for aid in arthrodesis.  Please note that the procedure included removal of the disc, removal of the posterior osteophytes, and removal of the posterior longitudinal ligament to ensure decompression of the spinal cord.  Additionally, foraminotomies were performed on both sides of the spinal canal to decompress the nerve roots.  We then moved to the C6/7 level. Curettes and pituitary rongeurs used to remove the majority of the disk, then the drill was used to remove the posterior osteophyte and begin the foraminotomies. The nerve hook was used to elevate the posterior longitudinal ligament, which was then removed with Kerrison rongeurs to complete decompression of the spinal cord. The Kerrison rongeurs were then used to complete the foraminotomies bilaterally to decompress the nerve roots. The nerve hook could be passed out each foramen, ensuring decompression of the nerve roots. Meticulous hemostasis was obtained. A biomechanical device (Globus Hedron C 7 mm height x 14 mm width by 12 mm depth) was placed at C6/7. The device had been filled with demineralized bone matrix for aid in arthrodesis.  Please note that  the procedure included removal of the disc, removal of the posterior osteophytes, and removal of the posterior longitudinal ligament to  ensure decompression of the spinal cord.  Additionally, foraminotomies were performed on both sides of the spinal canal to decompress the nerve roots.  The caspar distractor was removed, and bone wax used for hemostasis. A separate, 38 mm 3 segment Nuvasive ACP plate was chosen to bridge the 3 segments.  Two screws placed in each vertebral body, respectively making sure the screws were behind the locking mechanism.  Final AP and lateral radiographs were taken.   Please note that the plate is not inclusive to the biomechanical devices.  The anchoring mechanism of the plate is completely separate from the biomechanical devices.  With everything in good position, the wound was irrigated copiously and meticulous hemostasis obtained.  Wound was closed in 2 layers using interrupted inverted 3-0 Vicryl sutures in the platysma and 3-0 monocryl on the dermis.  The wound was dressed with dermabond, the head of bed at 30 degrees, taken to recovery room in stable condition.  No new postop neurological deficits were identified.  Sponge and pattie counts were correct at the end of the procedure.     I performed the entire procedure with the assistance of George Goods PA as an Designer, television/film set. An assistant was required for this procedure due to the complexity.  The assistant provided assistance in tissue manipulation and suction, and was required for the successful and safe performance of the procedure. I performed the critical portions of the procedure.   Reeves Daisy MD

## 2023-11-14 ENCOUNTER — Encounter: Payer: Self-pay | Admitting: Neurosurgery

## 2023-11-23 ENCOUNTER — Other Ambulatory Visit: Payer: Self-pay

## 2023-11-29 NOTE — Progress Notes (Unsigned)
   REFERRING PHYSICIAN:  Valora Agent, Md [address] [place],  [place] [address]  DOS: 11/13/23 C5-7 ACDF   HISTORY OF PRESENT ILLNESS: George Padilla is about 2.5 weeks status post ACDF. Overall, he is doing well since surgery and has stopped his post-op pain medications. He reports significant improvement of his neck and arm pain and resolution of the numbness and tingling he had before surgery.  He states he has been having more low back pain but attributes this to a decrease in physical activity.  PHYSICAL EXAMINATION:  NEUROLOGICAL:  General: In no acute distress.   Awake, alert, oriented to person, place, and time.  Pupils equal round and reactive to light.   Strength: Side Biceps Triceps Deltoid Interossei Grip Wrist Ext. Wrist Flex.  R 5 5 5 5 5 5 5   L 5 5 5 5 5 5 5    Incision c/d/I and healing well   Imaging:  No interval imaging to review  Assessment / Plan: George Padilla is doing well after recent ACDF. We discussed activity escalation and I have advised the patient to lift up to 10 pounds until 6 weeks after surgery, then increase up to 25 pounds until 12 weeks after surgery.  After 12 weeks post-op, the patient advised to increase activity as tolerated. he will return to clinic in approximately 4 weeks with cervical xrays prior or sooner should he have any questions or concerns. He expressed understanding and was in agreement with this plan.  Edsel Goods PA-C Dept of Neurosurgery

## 2023-11-30 ENCOUNTER — Encounter: Payer: Self-pay | Admitting: Neurosurgery

## 2023-11-30 ENCOUNTER — Ambulatory Visit: Payer: Self-pay | Admitting: Neurosurgery

## 2023-11-30 VITALS — BP 130/78 | Temp 98.3°F | Ht 68.0 in | Wt 197.0 lb

## 2023-11-30 DIAGNOSIS — Z981 Arthrodesis status: Secondary | ICD-10-CM

## 2023-11-30 DIAGNOSIS — R29898 Other symptoms and signs involving the musculoskeletal system: Secondary | ICD-10-CM

## 2023-11-30 DIAGNOSIS — M5412 Radiculopathy, cervical region: Secondary | ICD-10-CM

## 2023-12-22 ENCOUNTER — Other Ambulatory Visit: Payer: Self-pay

## 2023-12-22 DIAGNOSIS — M5412 Radiculopathy, cervical region: Secondary | ICD-10-CM

## 2023-12-28 ENCOUNTER — Encounter: Payer: Self-pay | Admitting: Neurosurgery

## 2023-12-28 ENCOUNTER — Ambulatory Visit: Payer: Self-pay

## 2023-12-28 ENCOUNTER — Ambulatory Visit (INDEPENDENT_AMBULATORY_CARE_PROVIDER_SITE_OTHER): Payer: Self-pay | Admitting: Neurosurgery

## 2023-12-28 VITALS — BP 126/74 | Temp 98.2°F | Ht 68.0 in | Wt 197.0 lb

## 2023-12-28 DIAGNOSIS — M5412 Radiculopathy, cervical region: Secondary | ICD-10-CM

## 2023-12-28 DIAGNOSIS — Z09 Encounter for follow-up examination after completed treatment for conditions other than malignant neoplasm: Secondary | ICD-10-CM

## 2023-12-28 NOTE — Progress Notes (Signed)
   REFERRING PHYSICIAN:  Stanton Lynwood FALCON, Md [address] [place],  [place] [address]  DOS: 11/13/23 C5-7 ACDF   HISTORY OF PRESENT ILLNESS: George Padilla is status post ACDF.   He is doing very well.  I am very pleased with his improvements.  He has minimal pain.  His strength is improved.   PHYSICAL EXAMINATION:  NEUROLOGICAL:  General: In no acute distress.   Awake, alert, oriented to person, place, and time.  Pupils equal round and reactive to light.   Strength: Side Biceps Triceps Deltoid Interossei Grip Wrist Ext. Wrist Flex.  R 5 5 5 5 5 5 5   L 5 5 5 5 5 5 5    Incision c/d/I and healing well   Imaging:  No complications noted Assessment / Plan: George Padilla is doing well after recent ACDF.   We discussed activity escalation and I have advised the patient to lift up to 10 pounds until 6 weeks after surgery, then increase up to 25 pounds until 12 weeks after surgery.  After 12 weeks post-op, the patient advised to increase activity as tolerated. he will return to clinic in approximately 4 weeks with cervical xrays prior or sooner should he have any questions or concerns. He expressed understanding and was in agreement with this plan.  Reeves Daisy MD Dept of Neurosurgery

## 2024-02-01 ENCOUNTER — Other Ambulatory Visit: Payer: Self-pay

## 2024-02-01 ENCOUNTER — Encounter: Payer: Self-pay | Admitting: Neurosurgery

## 2024-02-01 DIAGNOSIS — R29898 Other symptoms and signs involving the musculoskeletal system: Secondary | ICD-10-CM

## 2024-02-01 DIAGNOSIS — M5412 Radiculopathy, cervical region: Secondary | ICD-10-CM

## 2024-02-07 NOTE — Progress Notes (Unsigned)
   REFERRING PHYSICIAN:  Valora Lynwood FAL[place], Md [address] [place],  [place] [address]  DOS: 11/13/23 C5-7 ACDF   HISTORY OF PRESENT ILLNESS:  02/08/24 [name] presents today for 3 month follow up of ACDF. He is doing well without any pain today.  George Padilla is status post ACDF.   He is doing very well.  I am very pleased with his improvements.  He has minimal pain.  His strength is improved.   PHYSICAL EXAMINATION:  NEUROLOGICAL:  General: In no acute distress.   Awake, alert, oriented to person, place, and time.  Pupils equal round and reactive to light.   Strength: Side Biceps Triceps Deltoid Interossei Grip Wrist Ext. Wrist Flex.  R 5 5 5 5 5 5 5   L 5 5 5 5 5 5 5    Incision healing well  Imaging:  02/08/24 cervical xrays  Without evidence of hardware malfunction   Assessment / Plan: George Padilla is doing well after recent ACDF. He can resume activities as tolerated. He is to return to clinic in 3 months to see Dr. Clois with cervical xrays prior or sooner should he have any questions or concerns.   Edsel Goods PA-C Dept of Neurosurgery

## 2024-02-08 ENCOUNTER — Ambulatory Visit (INDEPENDENT_AMBULATORY_CARE_PROVIDER_SITE_OTHER): Payer: Self-pay

## 2024-02-08 ENCOUNTER — Ambulatory Visit: Payer: Self-pay | Admitting: Neurosurgery

## 2024-02-08 VITALS — BP 126/82 | Temp 98.4°F | Ht 68.0 in | Wt 205.5 lb

## 2024-02-08 DIAGNOSIS — M5412 Radiculopathy, cervical region: Secondary | ICD-10-CM

## 2024-02-08 DIAGNOSIS — R29898 Other symptoms and signs involving the musculoskeletal system: Secondary | ICD-10-CM

## 2024-02-08 DIAGNOSIS — Z981 Arthrodesis status: Secondary | ICD-10-CM

## 2024-05-07 ENCOUNTER — Encounter: Payer: Self-pay | Admitting: Neurosurgery

## 2024-05-07 ENCOUNTER — Other Ambulatory Visit: Payer: Self-pay
